# Patient Record
Sex: Female | Born: 1945 | Race: White | Hispanic: No | Marital: Married | Smoking: Never smoker
Health system: Southern US, Community
[De-identification: ages and names within clinical notes are randomized; demographics above are authoritative.]

## PROBLEM LIST (undated history)

## (undated) DIAGNOSIS — I447 Left bundle-branch block, unspecified: Secondary | ICD-10-CM

## (undated) DIAGNOSIS — F419 Anxiety disorder, unspecified: Secondary | ICD-10-CM

## (undated) DIAGNOSIS — I10 Essential (primary) hypertension: Secondary | ICD-10-CM

## (undated) DIAGNOSIS — R197 Diarrhea, unspecified: Secondary | ICD-10-CM

## (undated) DIAGNOSIS — I4891 Unspecified atrial fibrillation: Secondary | ICD-10-CM

## (undated) DIAGNOSIS — I2510 Atherosclerotic heart disease of native coronary artery without angina pectoris: Secondary | ICD-10-CM

## (undated) DIAGNOSIS — M1990 Unspecified osteoarthritis, unspecified site: Secondary | ICD-10-CM

## (undated) DIAGNOSIS — I219 Acute myocardial infarction, unspecified: Secondary | ICD-10-CM

## (undated) DIAGNOSIS — Z9289 Personal history of other medical treatment: Secondary | ICD-10-CM

## (undated) DIAGNOSIS — E785 Hyperlipidemia, unspecified: Secondary | ICD-10-CM

## (undated) DIAGNOSIS — E039 Hypothyroidism, unspecified: Secondary | ICD-10-CM

## (undated) HISTORY — DX: Unspecified osteoarthritis, unspecified site: M19.90

## (undated) HISTORY — DX: Hypothyroidism, unspecified: E03.9

## (undated) HISTORY — DX: Essential (primary) hypertension: I10

## (undated) HISTORY — DX: Hyperlipidemia, unspecified: E78.5

## (undated) HISTORY — PX: OTHER SURGICAL HISTORY: SHX169

## (undated) HISTORY — DX: Personal history of other medical treatment: Z92.89

## (undated) HISTORY — DX: Acute myocardial infarction, unspecified: I21.9

## (undated) HISTORY — DX: Left bundle-branch block, unspecified: I44.7

## (undated) HISTORY — PX: COLONOSCOPY: SHX174

## (undated) HISTORY — DX: Unspecified atrial fibrillation: I48.91

## (undated) HISTORY — DX: Anxiety disorder, unspecified: F41.9

## (undated) HISTORY — DX: Atherosclerotic heart disease of native coronary artery without angina pectoris: I25.10

---

## 2006-12-29 ENCOUNTER — Ambulatory Visit (HOSPITAL_COMMUNITY): Admission: RE | Admit: 2006-12-29 | Discharge: 2006-12-29 | Payer: Self-pay | Admitting: Family Medicine

## 2007-01-13 ENCOUNTER — Ambulatory Visit (HOSPITAL_COMMUNITY): Admission: RE | Admit: 2007-01-13 | Discharge: 2007-01-13 | Payer: Self-pay | Admitting: Family Medicine

## 2007-04-08 ENCOUNTER — Ambulatory Visit (HOSPITAL_COMMUNITY): Admission: RE | Admit: 2007-04-08 | Discharge: 2007-04-08 | Payer: Self-pay | Admitting: Family Medicine

## 2009-01-06 ENCOUNTER — Inpatient Hospital Stay (HOSPITAL_COMMUNITY): Admission: EM | Admit: 2009-01-06 | Discharge: 2009-01-15 | Payer: Self-pay | Admitting: Cardiology

## 2009-01-06 ENCOUNTER — Encounter: Payer: Self-pay | Admitting: Emergency Medicine

## 2009-01-06 ENCOUNTER — Ambulatory Visit: Payer: Self-pay | Admitting: Cardiology

## 2009-01-08 ENCOUNTER — Encounter: Payer: Self-pay | Admitting: Cardiovascular Disease

## 2009-01-08 HISTORY — PX: CARDIAC CATHETERIZATION: SHX172

## 2009-04-23 ENCOUNTER — Ambulatory Visit (HOSPITAL_COMMUNITY): Admission: RE | Admit: 2009-04-23 | Discharge: 2009-04-23 | Payer: Self-pay | Admitting: Family Medicine

## 2009-06-12 ENCOUNTER — Other Ambulatory Visit: Admission: RE | Admit: 2009-06-12 | Discharge: 2009-06-12 | Payer: Self-pay | Admitting: Obstetrics and Gynecology

## 2010-06-26 DIAGNOSIS — Z9289 Personal history of other medical treatment: Secondary | ICD-10-CM

## 2010-06-26 HISTORY — DX: Personal history of other medical treatment: Z92.89

## 2010-12-09 LAB — CBC
HCT: 33.8 % — ABNORMAL LOW (ref 36.0–46.0)
HCT: 34.7 % — ABNORMAL LOW (ref 36.0–46.0)
HCT: 33.5 % — ABNORMAL LOW (ref 36.0–46.0)
HCT: 35.7 % — ABNORMAL LOW (ref 36.0–46.0)
HCT: 37.7 % (ref 36.0–46.0)
Hemoglobin: 11.6 g/dL — ABNORMAL LOW (ref 12.0–15.0)
Hemoglobin: 12.6 g/dL (ref 12.0–15.0)
Hemoglobin: 12.1 g/dL (ref 12.0–15.0)
Hemoglobin: 12 g/dL (ref 12.0–15.0)
Hemoglobin: 13.1 g/dL (ref 12.0–15.0)
MCHC: 34.7 g/dL (ref 30.0–36.0)
MCHC: 34.6 g/dL (ref 30.0–36.0)
MCHC: 34.7 g/dL (ref 30.0–36.0)
MCHC: 34.8 g/dL (ref 30.0–36.0)
MCHC: 34.8 g/dL (ref 30.0–36.0)
MCV: 92.9 fL (ref 78.0–100.0)
MCV: 93.5 fL (ref 78.0–100.0)
MCV: 92.5 fL (ref 78.0–100.0)
MCV: 92.6 fL (ref 78.0–100.0)
MCV: 93.3 fL (ref 78.0–100.0)
MCV: 93.4 fL (ref 78.0–100.0)
MCV: 94.3 fL (ref 78.0–100.0)
Platelets: 183 10*3/uL (ref 150–400)
Platelets: 223 10*3/uL (ref 150–400)
Platelets: 189 10*3/uL (ref 150–400)
Platelets: 198 10*3/uL (ref 150–400)
Platelets: 199 10*3/uL (ref 150–400)
RBC: 3.59 MIL/uL — ABNORMAL LOW (ref 3.87–5.11)
RBC: 3.99 MIL/uL (ref 3.87–5.11)
RDW: 13.1 % (ref 11.5–15.5)
RDW: 13.4 % (ref 11.5–15.5)
RDW: 12.9 % (ref 11.5–15.5)
RDW: 13.1 % (ref 11.5–15.5)
RDW: 13.3 % (ref 11.5–15.5)
RDW: 13.4 % (ref 11.5–15.5)
WBC: 4.8 10*3/uL (ref 4.0–10.5)
WBC: 4.9 10*3/uL (ref 4.0–10.5)
WBC: 5.8 10*3/uL (ref 4.0–10.5)

## 2010-12-09 LAB — BASIC METABOLIC PANEL
BUN: 16 mg/dL (ref 6–23)
BUN: 10 mg/dL (ref 6–23)
BUN: 10 mg/dL (ref 6–23)
CO2: 29 mEq/L (ref 19–32)
CO2: 26 mEq/L (ref 19–32)
CO2: 27 mEq/L (ref 19–32)
CO2: 28 mEq/L (ref 19–32)
Calcium: 9.4 mg/dL (ref 8.4–10.5)
Calcium: 8.5 mg/dL (ref 8.4–10.5)
Calcium: 8.9 mg/dL (ref 8.4–10.5)
Chloride: 109 mEq/L (ref 96–112)
Chloride: 107 mEq/L (ref 96–112)
Chloride: 104 mEq/L (ref 96–112)
Creatinine, Ser: 0.58 mg/dL (ref 0.4–1.2)
Creatinine, Ser: 0.59 mg/dL (ref 0.4–1.2)
Creatinine, Ser: 0.56 mg/dL (ref 0.4–1.2)
Creatinine, Ser: 0.63 mg/dL (ref 0.4–1.2)
GFR calc non Af Amer: >60 mL/min (ref >60)
GFR calc non Af Amer: >60 mL/min (ref >60)
GFR calc non Af Amer: >60 mL/min (ref >60)
GFR calc non Af Amer: >60 mL/min (ref >60)
Glucose, Bld: 141 mg/dL — ABNORMAL HIGH (ref 70–99)
Glucose, Bld: 107 mg/dL — ABNORMAL HIGH (ref 70–99)
Glucose, Bld: 93 mg/dL (ref 70–99)
Glucose, Bld: 97 mg/dL (ref 70–99)
Potassium: 4.3 mEq/L (ref 3.5–5.1)
Potassium: 4.1 mEq/L (ref 3.5–5.1)
Potassium: 4 mEq/L (ref 3.5–5.1)
Sodium: 138 mEq/L (ref 135–145)
Sodium: 141 mEq/L (ref 135–145)
Sodium: 134 mEq/L — ABNORMAL LOW (ref 135–145)

## 2010-12-09 LAB — CARDIAC PANEL(CRET KIN+CKTOT+MB+TROPI)
CK, MB: 15.3 ng/mL — ABNORMAL HIGH (ref 0.3–4.0)
CK, MB: 6.6 ng/mL — ABNORMAL HIGH (ref 0.3–4.0)
CK, MB: 8.3 ng/mL — ABNORMAL HIGH (ref 0.3–4.0)
Relative Index: 9 — ABNORMAL HIGH (ref 0.0–2.5)
Total CK: 113 U/L (ref 7–177)
Total CK: 170 U/L (ref 7–177)
Total CK: 81 U/L (ref 7–177)
Troponin I: 0.44 ng/mL — ABNORMAL HIGH (ref 0.00–0.06)

## 2010-12-09 LAB — POCT CARDIAC MARKERS: Myoglobin, poc: 51.8 ng/mL (ref 12–200)

## 2010-12-09 LAB — DIFFERENTIAL
Basophils Absolute: 0 10*3/uL (ref 0.0–0.1)
Eosinophils Absolute: 0 10*3/uL (ref 0.0–0.7)
Eosinophils Relative: 0 % (ref 0–5)
Lymphocytes Relative: 13 % (ref 12–46)
Monocytes Absolute: 0.5 10*3/uL (ref 0.1–1.0)

## 2010-12-09 LAB — COMPREHENSIVE METABOLIC PANEL
ALT: 20 U/L (ref 0–35)
AST: 31 U/L (ref 0–37)
Albumin: 3 g/dL — ABNORMAL LOW (ref 3.5–5.2)
Calcium: 8.6 mg/dL (ref 8.4–10.5)
Chloride: 106 mEq/L (ref 96–112)
Creatinine, Ser: 0.57 mg/dL (ref 0.4–1.2)
Sodium: 140 mEq/L (ref 135–145)

## 2010-12-09 LAB — HEPARIN LEVEL (UNFRACTIONATED)
Heparin Unfractionated: 0.54 IU/mL (ref 0.30–0.70)
Heparin Unfractionated: 0.58 IU/mL (ref 0.30–0.70)

## 2010-12-09 LAB — PROTIME-INR
INR: 1.9 — ABNORMAL HIGH (ref 0.00–1.49)
INR: 2 — ABNORMAL HIGH (ref 0.00–1.49)
INR: 2.1 — ABNORMAL HIGH (ref 0.00–1.49)
INR: 1.1 (ref 0.00–1.49)
Prothrombin Time: 24.7 seconds — ABNORMAL HIGH (ref 11.6–15.2)
Prothrombin Time: 13.3 seconds (ref 11.6–15.2)
Prothrombin Time: 14.5 seconds (ref 11.6–15.2)
Prothrombin Time: 15.2 seconds (ref 11.6–15.2)
Prothrombin Time: 21.5 seconds — ABNORMAL HIGH (ref 11.6–15.2)

## 2010-12-17 ENCOUNTER — Other Ambulatory Visit: Payer: Self-pay | Admitting: Cardiovascular Disease

## 2010-12-22 ENCOUNTER — Other Ambulatory Visit: Payer: Self-pay

## 2010-12-22 NOTE — Telephone Encounter (Signed)
Refill for simvastatin denied.  Patient is not being followed by Dr. Mariah Milling.

## 2011-01-13 NOTE — Cardiovascular Report (Signed)
NAMEGALENA, LOGIE NO.:  [id number]   MEDICAL RECORD NO.:  [id number]          PATIENT TYPE:  INP   LOCATION:  2917                         FACILITY:  [facility]   PHYSICIAN:  Antonieta Iba, MD   DATE OF BIRTH:  01-27-1946   DATE OF PROCEDURE:  DATE OF DISCHARGE:                            CARDIAC CATHETERIZATION   PHYSICIAN PERFORMING THE PROCEDURE:  Antonieta Iba, MD   REASON FOR PROCEDURE:  Ms. Doyle is a very pleasant 65 year old woman,  previously seen by Dr. Domingo Sep in the clinic, who presented with a non-  ST-elevation MI.  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  The chest pain re-presented on Saturday, 2 days ago,  lasting for several hours.  She went to [facility] and her pain  resolved with nitroglycerin.  Her initial point-of-care and the cardiac  enzymes were elevated and she was transferred to [facility].  Since her arrival, she has been pain free.  A repeat cardiac enzymes  were 1.6 with a CK-MB of 15.  She presents to the cardiac  catheterization for further evaluation of her coronary anatomy.   PROCEDURE IN DETAIL:  The risks and benefits of the procedure were  discussed with the patient and consent was obtained.  She was brought to  the cardiac catheterization lab, and prepped and draped in the usual  sterile fashion.  The modified Seldinger technique was used to cannulate  the right femoral artery.  A 5-French sheath was inserted.  A Judkins  left #4 and a right #4 catheter were used to engage the left main and  the RCA ostium respectively.  Hand injection of contrast was used to  visualize the coronary anatomy with cinematography.  A 5-French pigtail  was used to cross the aortic valve and a LV gram was performed.  The  sheath was removed at the end of the case and manual pressure held and  hemostasis obtained.  No complications were reported at the time of this  dictation.   CORONARY  ANATOMY:  Left Main:  The left main is a moderate-to-large  sized vessel that bifurcates into LAD and left circumflex.  There is no  significant disease noted.   Left Anterior Descending:  The LAD is a moderate-to-large sized vessel  with several small diagonal branches.  There is mild disease in the  proximal LAD, estimated at 20%-30% in sequential lesions.  Otherwise, no  significant disease noted.   Left Circumflex:  Moderate-to-large sized vessel with a moderate-sized  OM taking off proximally.  There is no significant disease noted.   Right Coronary Artery:  The dominant vessel that extends distally with a  PD branch and PL branch.  There is 20% proximal to mid  RCA disease that  is diffuse and 30% ostial PDA disease.   LV Gram:  LV gram shows ejection fraction estimated at 30%-35% with some  apical, inferoapical, and anteroapical hypokinesis.  There is mild  aortic stenosis and pull back with a gradient of 20 mmHg.  FINAL IMPRESSION:  Mild coronary artery disease, notably in the left  anterior descending and the right coronary artery.  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  She also has a mild gradient across her aortic  valve.  She will be managed medically for coronary artery disease, kept  overnight given her elevated troponins of 1.6 with repeat troponins  pending.  She will follow up in clinic for outpatient echocardiogram to  evaluate her aortic valve and her ejection fraction.      Antonieta Iba, MD  Electronically Signed     TJG/MEDQ  D:  01/07/2009  T:  01/08/2009  Job:  [id number]

## 2011-01-13 NOTE — H&P (Signed)
NAMECHANYA, CHRISLEY NO.:  [id number]   MEDICAL RECORD NO.:  [id number]          PATIENT TYPE:  INP   LOCATION:  2917                         FACILITY:  [facility]   PHYSICIAN:  Marca Ancona, MD      DATE OF BIRTH:  07-Aug-1946   DATE OF ADMISSION:  01/06/2009  DATE OF DISCHARGE:                              HISTORY & PHYSICAL   PRIMARY CARDIOLOGIST:  Georga Hacking, MD   PRIMARY CARE PHYSICIAN:  [facility] in [place].   HISTORY OF PRESENT ILLNESS:  This is a 65 year old with minimal past  medical history who presents with a non-ST elevation MI.  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  One week ago, she developed substernal chest  pressure at work, which was mild and lasted for about 4 hours before  resolving.  It was mild, so she kept on working.  Today, the patient was  at work again and developed more severe substernal chest pain, this was  a pressure sensation in her chest associated with nausea and shortness  of breath.  The pain began at 12 p.m., she went home at 3:30.  She was  still having chest pain at that time.  She laid down on the bed and the  pain decreased, but whenever she would get up to walk around, the pain  would increase again.  As the pain continued, she went to [facility] at 8:30 tonight.  The pain did resolve at [facility] with  nitroglycerin.  Her initial set of cardiac markers was positive, so the  patient was transferred here to [facility].  She had been pain  free when she left [facility], and she was pain free when she arrived to  [facility].   PAST MEDICAL HISTORY:  1. 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  2. Heart murmur.  3. Stress test about 1-2 years ago that per the patient's report was      negative.   MEDICATIONS:  Naprosyn p.r.n.   ALLERGIES:  No known drug allergies.   SOCIAL HISTORY:  The patient lives in [place] with her husband.  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  She is married.  She has 3 children.  She has never smoked.  Does not drink alcohol.  Does not use any illicit drugs.   FAMILY HISTORY:  There is no premature coronary artery disease.  Her  mother had lymphoma.   REVIEW OF SYSTEMS:  All systems were reviewed and were negative except  as noted in the history of present illness.   PHYSICAL EXAMINATION:  VITAL SIGNS:  The patient is afebrile.  Pulse is  78, blood pressure 130/76, and oxygen saturation 98% on 2 L nasal  cannula.  GENERAL:  This is a well-developed female in no apparent distress.  HEENT:  Normal exam.  ABDOMEN:  Soft, nontender.  Normal bowel sounds.  No hepatosplenomegaly.  NECK:  Supple without lymphadenopathy.  No thyromegaly or thyroid  nodule.  There is somewhat elevated JVP at  about 9-10 cm of water.  CARDIOVASCULAR:  Heart regular.  S1 and S2.  No S3, no S4.  There is no  murmur.  There are 2+ posterior tibial pulses bilaterally.  There is no  peripheral edema.  EXTREMITIES:  No clubbing or cyanosis.  LUNGS:  Clear to auscultation bilaterally with normal respiratory  effort.  MUSCULOSKELETAL:  Normal exam.  SKIN:  Normal exam.  NEUROLOGIC:  Alert and oriented x3 with normal affect.   RADIOLOGY:  Chest x-ray shows cardiomegaly without acute disease.  EKG:  Normal sinus rhythm, left axis deviation, LVH with repolarization  abnormality, borderline left bundle-branch block.   LABORATORY DATA:  White count of 8.2, hematocrit 34.7, and platelets  223.  Potassium 4.1, creatinine 0.59.  BNP 249.  CK-MB 9.9, troponin was  0.54 initial set.   ASSESSMENT AND PLAN:  This is a 65 year old with minimal past medical  history who presents with a non-ST elevation myocardial infarction.  1. Non-ST elevation myocardial infarction.  The patient does have      elevated enzymes with suggestive chest pain.  She has no prior      history of coronary artery disease.  She does report a stress test      1-2 years ago that  was negative.  Interestingly, she really has      essentially no risk factors for coronary artery disease.  She is      now chest pain free.  We will continue her nitroglycerin paste and      her heparin drip.  She will be continued on aspirin.  We will start      Lopressor 12.5 mg q.6 h., high-dose statin, Plavix 600 mg load and      then 75 mg daily.  We will plan on heart catheterization in the      morning.  2. Volume status.  The patient does appear mildly volume overloaded      with increased jugular venous pressure and elevated BNP.  I will      hold any IV fluids for now.  When she has a heart catheterization      in the morning, we will check a ventriculogram.      Marca Ancona, MD  Electronically Signed     DM/MEDQ  D:  01/07/2009  T:  01/07/2009  Job:  [id number]

## 2011-01-13 NOTE — Discharge Summary (Signed)
Shelby Brewer, Shelby Brewer                  ACCOUNT NO.:  [id number]   MEDICAL RECORD NO.:  [id number]          PATIENT TYPE:  INP   LOCATION:  2023                         FACILITY:  [facility]   PHYSICIAN:  Antonieta Iba, MD   DATE OF BIRTH:  10/15/1945   DATE OF ADMISSION:  01/06/2009  DATE OF DISCHARGE:  01/15/2009                               DISCHARGE SUMMARY   DISCHARGE DIAGNOSES:  1. Non-ST elevation myocardial infarction this admission.  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  3. Paroxysmal atrial fibrillation, sinus rhythm on amiodarone and      Coumadin at discharge.  4. Hypotension, stable at discharge.   HOSPITAL COURSE:  The patient is a 65 year old female from [facility] who presented on Jan 07, 2009 with unstable angina  from [place].  She was transferred as a non-ST elevation MI.  She was  initially admitted by Dr. Tresa Endo, but after arrival was determined that  the patient has actually seen our group in the past.  The patient was  seen by Dr. Mariah Milling on Jan 07, 2009.  Her troponin was 1.59.  She was  taken to the cath lab by Dr. Mariah Milling on Jan 07, 2009.  This revealed a  20% RCA, normal left main, normal circumflex, and 30% LAD.  EF was 30-  35% with apical, inferoapical, and anteroapical hypokinesis consistent  with Tako-Tsubo type cardiomyopathy.  She also had mild AS with a 20-mm  gradient.  She was put on medical therapy.  ACE inhibitor, beta-blocker,  aspirin, Plavix, and statin were added.  She was transferred to  telemetry and ambulated.  Followup echocardiogram done on Jan 08, 2009  showed her EF had already improved to 50-55% with mild anterior  hypokinesis.  We were about to discharge her on Jan 09, 2009 when she  went in atrial fibrillation with increased ventricular response  associated with hypotension.  This resolved  spontaneously.  We held off  on her ACE inhibitor and changed her metoprolol 25 mg q.8.  She had  recurrent atrial fibrillation and we started her on heparin later on Jan 09, 2009.  She was in and out of atrial fibrillation with hypotension on  Jan 10, 2009.  We added Coumadin.  Lanoxin was added at one point for  rate control as her AF was fast.  She also has a left bundle-branch  block pattern.  We gently tried to adjust her metoprolol and added  diltiazem.  Hypotension continued to be an issue.  We started amiodarone  on Jan 13, 2009.  By Jan 14, 2009, her rhythm had been mainly sinus  although she still had some breakthrough atrial fibrillation.  We ended  up stopping her Lanoxin and diltiazem and cut back on her beta-blocker.  By Jan 15, 2009, she has been relatively stable.  She did have a  breakthrough run of atrial fibrillation during the night but it was  short-lived and less  than 100 and not symptomatic.  We feel she can be  discharged on Jan 15, 2009.  She will follow up with Dr. Mariah Milling in  [place].  We cut her amiodarone back to 200 mg b.i.d. at discharge.   DISCHARGE MEDICATIONS:  1. Metoprolol 50 mg b.i.d.  2. Aspirin 81 mg a day.  3. Pepcid 20 mg a day.  4. Crestor 10 mg a day.  5. Amiodarone 200 mg twice a day.  6. Coumadin 2.5 mg a day or as directed.  7. Nitroglycerin sublingual p.r.n.   DISCHARGE LABORATORY DATA:  INR is 2.0.  Sodium 38, potassium 4.2, BUN  14, and creatinine 0.69.  White count 5.8, hemoglobin 12.8, hematocrit  37.1, and platelets 206.  EKG shows sinus rhythm with left bundle-branch  block.  X-ray on Jan 06, 2009 shows cardiomegaly without acute disease.  Other labs during this admission, TSH 3.17.  Troponins peaked at 0.49  with a CK of 112 and an MB of 8.  Liver functions were normal.   DISPOSITION:  The patient is discharged in stable condition and will  followup with Dr. Mariah Milling in [place].  She will have a Protime this  Thursday.  Her  amiodarone can be cut back further as an outpatient.  Her  appointment is scheduled for January 31, 2009 at 9:30.      Abelino Derrick, P.A.      Antonieta Iba, MD  Electronically Signed    LKK/MEDQ  D:  01/15/2009  T:  01/15/2009  Job:  [id number]   cc:   [facility]

## 2011-01-15 ENCOUNTER — Other Ambulatory Visit (HOSPITAL_COMMUNITY): Payer: Self-pay | Admitting: Family Medicine

## 2011-01-15 DIAGNOSIS — Z139 Encounter for screening, unspecified: Secondary | ICD-10-CM

## 2011-01-16 NOTE — Procedures (Signed)
Shelby Brewer, Shelby Brewer                  ACCOUNT NO.:  [id number]   MEDICAL RECORD NO.:  [id number]          PATIENT TYPE:  OUT   LOCATION:  RESP                          FACILITY:  [facility]   PHYSICIAN:  Edward L. Juanetta Gosling, M.D.DATE OF BIRTH:  1946/02/28   DATE OF PROCEDURE:  DATE OF DISCHARGE:  04/08/2007                            PULMONARY FUNCTION TEST   1. Spirometry shows no evidence of airflow obstruction, normal      capacities.  2. Lung volumes are normal.  3. DLCO is normal.  4. Arterial blood gases are normal.      Edward L. Juanetta Gosling, M.D.  Electronically Signed     ELH/MEDQ  D:  04/14/2007  T:  04/15/2007  Job:  [id number]   cc:   Kirk Ruths, M.D.  Fax: [phone]

## 2011-01-22 ENCOUNTER — Ambulatory Visit (HOSPITAL_COMMUNITY)
Admission: RE | Admit: 2011-01-22 | Discharge: 2011-01-22 | Disposition: A | Discharge status: Home / Self Care | Payer: Medicare Other | Source: Ambulatory Visit | Attending: Family Medicine | Admitting: Family Medicine

## 2011-01-22 DIAGNOSIS — Z139 Encounter for screening, unspecified: Secondary | ICD-10-CM

## 2011-01-22 DIAGNOSIS — Z1231 Encounter for screening mammogram for malignant neoplasm of breast: Secondary | ICD-10-CM | POA: Insufficient documentation

## 2011-06-01 DIAGNOSIS — Z9289 Personal history of other medical treatment: Secondary | ICD-10-CM

## 2011-06-01 HISTORY — DX: Personal history of other medical treatment: Z92.89

## 2011-06-15 LAB — BLOOD GAS, ARTERIAL
Bicarbonate: 26.2 — ABNORMAL HIGH
TCO2: 23.2
pH, Arterial: 7.416 — ABNORMAL HIGH
pO2, Arterial: 93.5

## 2011-11-26 ENCOUNTER — Other Ambulatory Visit (HOSPITAL_COMMUNITY): Payer: Self-pay | Admitting: Physician Assistant

## 2011-11-26 ENCOUNTER — Ambulatory Visit (HOSPITAL_COMMUNITY)
Admission: RE | Admit: 2011-11-26 | Discharge: 2011-11-26 | Disposition: A | Discharge status: Home / Self Care | Payer: Medicare Other | Source: Ambulatory Visit | Attending: Physician Assistant | Admitting: Physician Assistant

## 2011-11-26 DIAGNOSIS — M79609 Pain in unspecified limb: Secondary | ICD-10-CM | POA: Insufficient documentation

## 2011-11-26 DIAGNOSIS — M25559 Pain in unspecified hip: Secondary | ICD-10-CM | POA: Insufficient documentation

## 2012-01-06 ENCOUNTER — Other Ambulatory Visit (HOSPITAL_COMMUNITY): Payer: Self-pay | Admitting: Family Medicine

## 2012-01-06 DIAGNOSIS — Z139 Encounter for screening, unspecified: Secondary | ICD-10-CM

## 2012-01-13 ENCOUNTER — Ambulatory Visit (HOSPITAL_COMMUNITY)
Admission: RE | Admit: 2012-01-13 | Discharge: 2012-01-13 | Disposition: A | Discharge status: Home / Self Care | Payer: Medicare Other | Source: Ambulatory Visit | Attending: Family Medicine | Admitting: Family Medicine

## 2012-01-13 DIAGNOSIS — Z139 Encounter for screening, unspecified: Secondary | ICD-10-CM

## 2012-01-13 DIAGNOSIS — Z780 Asymptomatic menopausal state: Secondary | ICD-10-CM | POA: Insufficient documentation

## 2012-01-13 DIAGNOSIS — Z13820 Encounter for screening for osteoporosis: Secondary | ICD-10-CM | POA: Insufficient documentation

## 2012-01-13 DIAGNOSIS — M899 Disorder of bone, unspecified: Secondary | ICD-10-CM | POA: Insufficient documentation

## 2012-01-22 ENCOUNTER — Other Ambulatory Visit (HOSPITAL_COMMUNITY): Payer: Self-pay | Admitting: Family Medicine

## 2012-01-22 DIAGNOSIS — Z139 Encounter for screening, unspecified: Secondary | ICD-10-CM

## 2012-01-26 ENCOUNTER — Ambulatory Visit (HOSPITAL_COMMUNITY)
Admission: RE | Admit: 2012-01-26 | Discharge: 2012-01-26 | Disposition: A | Discharge status: Home / Self Care | Payer: Medicare Other | Source: Ambulatory Visit | Attending: Family Medicine | Admitting: Family Medicine

## 2012-01-26 DIAGNOSIS — Z1231 Encounter for screening mammogram for malignant neoplasm of breast: Secondary | ICD-10-CM | POA: Insufficient documentation

## 2012-01-26 DIAGNOSIS — Z139 Encounter for screening, unspecified: Secondary | ICD-10-CM

## 2012-01-27 ENCOUNTER — Other Ambulatory Visit: Payer: Self-pay

## 2012-01-27 ENCOUNTER — Telehealth: Payer: Self-pay

## 2012-01-27 DIAGNOSIS — Z139 Encounter for screening, unspecified: Secondary | ICD-10-CM

## 2012-01-27 NOTE — Telephone Encounter (Signed)
MOVI PREP SPLIT DOSING, REGULAR BREAKFAST. CLEAR LIQUIDS AFTER 9 AM.  

## 2012-01-27 NOTE — Telephone Encounter (Signed)
REVIEWED.  

## 2012-01-27 NOTE — Telephone Encounter (Signed)
Gastroenterology Pre-Procedure Form  Pt said she has A-Fib and having problems with a rotator cuff only problems  Request Date: 01/27/2012    Requesting Physician: Dr. Regino Schultze     PATIENT INFORMATION:  Shelby Brewer is a 66 y.o., female (DOB=[birth year]).  PROCEDURE: Procedure(s) requested: colonoscopy Procedure Reason: screening for colon cancer  PATIENT REVIEW QUESTIONS: The patient reports the following:   1. Diabetes Melitis: no 2. Joint replacements in the past 12 months: no 3. Major health problems in the past 3 months: no 4. Has an artificial valve or MVP:no 5. Has been advised in past to take antibiotics in advance of a procedure like teeth cleaning: no}    MEDICATIONS & ALLERGIES:    Patient reports the following regarding taking any blood thinners:   Plavix? no Aspirin?yes  Coumadin?  no  Patient confirms/reports the following medications:  Current Outpatient Prescriptions  Medication Sig Dispense Refill  . aspirin 81 MG tablet Take 81 mg by mouth daily.      . clonazePAM (KLONOPIN) 1 MG tablet Take 1 mg by mouth 2 (two) times daily as needed.      Marland Kitchen levothyroxine (SYNTHROID, LEVOTHROID) 50 MCG tablet Take 50 mcg by mouth daily.      . meloxicam (MOBIC) 7.5 MG tablet Take 7.5 mg by mouth daily. Takes two tablets at once daily      . metoprolol tartrate (LOPRESSOR) 25 MG tablet Take 25 mg by mouth 2 (two) times daily. Pt takes two tablets in the Am and two in the PM      . nitroGLYCERIN (NITROSTAT) 0.4 MG SL tablet Place 0.4 mg under the tongue every 5 (five) minutes as needed.      . NON FORMULARY Calcium 600 mg Plus Vit D    One tablet daily      . simvastatin (ZOCOR) 20 MG tablet Take 20 mg by mouth every evening.        Patient confirms/reports the following allergies:  No Known Allergies  Patient is appropriate to schedule for requested procedure(s): yes  AUTHORIZATION INFORMATION Primary Insurance:   ID #:   Group #:  Pre-Cert / Auth required: Pre-Cert /  Auth #:   Secondary Insurance:   ID #:  Group #:  Pre-Cert / Auth required:  Pre-Cert / Auth #:   No orders of the defined types were placed in this encounter.    SCHEDULE INFORMATION: Procedure has been scheduled as follows:  Date: 02/12/2012    Time: 10:30 AM  Location: [facility] Short Stay  This Gastroenterology Pre-Precedure Form is being routed to the following provider(s) for review: Jonette Eva, MD

## 2012-01-28 NOTE — Telephone Encounter (Signed)
Rx and instructions mailed to pt.  

## 2012-02-08 ENCOUNTER — Telehealth: Payer: Self-pay | Admitting: Gastroenterology

## 2012-02-08 NOTE — Telephone Encounter (Signed)
Patient called and stated that her daughter has lymphoma and she is having to take care of her right now and she has to cancel her procedure but she will call back to R/S when things have settled with her daughter.

## 2012-02-12 ENCOUNTER — Ambulatory Visit (HOSPITAL_COMMUNITY): Admission: RE | Admit: 2012-02-12 | Payer: Medicare Other | Source: Ambulatory Visit | Admitting: Gastroenterology

## 2012-02-12 ENCOUNTER — Encounter (HOSPITAL_COMMUNITY): Admission: RE | Payer: Self-pay | Source: Ambulatory Visit

## 2012-02-12 SURGERY — COLONOSCOPY
Anesthesia: Moderate Sedation

## 2012-10-26 ENCOUNTER — Telehealth: Payer: Self-pay | Admitting: *Deleted

## 2012-10-26 NOTE — Telephone Encounter (Signed)
Shelby Brewer called today to be triaged for a colonoscopy. Please call her back. Thank you.

## 2012-10-26 NOTE — Telephone Encounter (Signed)
Pt is scheduled for an OV on 11/15/2012 at 1:30 PM with Lorenza Burton, NP, for diarrhea, abdominal cramps, and a lot of gas, prior to scheduling colonoscopy.

## 2012-11-15 ENCOUNTER — Encounter: Payer: Self-pay | Admitting: Urgent Care

## 2012-11-15 ENCOUNTER — Ambulatory Visit (INDEPENDENT_AMBULATORY_CARE_PROVIDER_SITE_OTHER): Payer: Medicare Other | Admitting: Urgent Care

## 2012-11-15 VITALS — BP 140/88 | HR 61 | Temp 97.4°F | Ht 64.0 in | Wt 125.0 lb

## 2012-11-15 DIAGNOSIS — R197 Diarrhea, unspecified: Secondary | ICD-10-CM

## 2012-11-15 DIAGNOSIS — K529 Noninfective gastroenteritis and colitis, unspecified: Secondary | ICD-10-CM

## 2012-11-15 MED ORDER — PEG 3350-KCL-NA BICARB-NACL 420 G PO SOLR: 4000.0000 mL | ORAL | Status: DC

## 2012-11-15 NOTE — Progress Notes (Signed)
Referring Provider: McGough, William, MD Primary Care Physician:  MCGOUGH,WILLIAM M, MD Primary Gastroenterologist:  Dr. Rourk  Chief Complaint  Patient presents with  . Colonoscopy    HPI:  Shelby Brewer is a 67 y.o. female here as a referral from Dr. McGough for diarrhea for 1 year.  She has been having abdominal bloating, flatus & cramps.   She has tried to watch what she eats.  She has had lots of chocolate recently.  Stools [place] #4, 6, or 7.  Denies rectal bleeding, melena, or mucus in stools.  +urgency.  BM 3-4 per day.  No recent foreign travel, antibiotics, or new meds.   Her mother & 1 child had non-Hodgkin's lymphoma.  Denies heartburn, indigestion, nausea, vomiting, dysphagia, odynophagia or anorexia.  She has recently gained a few pounds.  Thyroid has not been checked recently but she has hx of hypothyroidism.  Last colonoscopy over 10 yrs ago.  She takes mobic for arthritis for the past year.   Past Medical History  Diagnosis Date  . Atrial fibrillation   . MI (myocardial infarction)   . Hypothyroid   . Anxiety   . Arthritis     No past surgical history on file.  Current Outpatient Prescriptions  Medication Sig Dispense Refill  . aspirin 81 MG tablet Take 81 mg by mouth daily.      . clonazePAM (KLONOPIN) 1 MG tablet Take 1 mg by mouth 2 (two) times daily as needed.      . levothyroxine (SYNTHROID, LEVOTHROID) 50 MCG tablet Take 50 mcg by mouth daily.      . meloxicam (MOBIC) 7.5 MG tablet Take 7.5 mg by mouth daily. Takes two tablets at once daily      . metoprolol tartrate (LOPRESSOR) 25 MG tablet Take 25 mg by mouth 2 (two) times daily. Pt takes two tablets in the Am and two in the PM      . nitroGLYCERIN (NITROSTAT) 0.4 MG SL tablet Place 0.4 mg under the tongue every 5 (five) minutes as needed.      . NON FORMULARY Calcium 600 mg Plus Vit D    One tablet daily      . simvastatin (ZOCOR) 20 MG tablet Take 20 mg by mouth every evening.       No current  facility-administered medications for this visit.    Allergies as of 11/15/2012  . (No Known Allergies)    Family History:There is no known family history of colorectal carcinoma , liver disease, or inflammatory bowel disease.  Problem Relation Age of Onset  . Lymphoma Mother   . Lymphoma Daughter     History   Social History  . Marital Status: Married    Spouse Name: N/A    Number of Children: 3  . Years of Education: N/A   Occupational History  . RETIRED; Waitress    Social History Main Topics  . Smoking status: Never Smoker   . Smokeless tobacco: Not on file  . Alcohol Use: No  . Drug Use: No  . Sexually Active: Not on file   Other Topics Concern  . Not on file   Social History Narrative  . No narrative on file    Review of Systems: Gen: see HPI.  Denies fever or chills. CV: Denies chest pain, angina, palpitations, syncope, orthopnea, PND, peripheral edema, and claudication. Resp: Denies dyspnea at rest, dyspnea with exercise, cough, sputum, wheezing, coughing up blood, and pleurisy. GI: Denies vomiting blood, jaundice, and fecal   incontinence.   Denies dysphagia or odynophagia. GU : Denies urinary burning, blood in urine, urinary frequency, urinary hesitancy, nocturnal urination, and urinary incontinence. MS: + joint pain from arthritis.  Denies limitation of movement, and swelling, low back pain Denies muscle weakness, cramps, atrophy.  Derm: Denies rash, itching, dry skin, hives, moles, warts, or unhealing ulcers.  Psych: Denies depression, anxiety, memory loss, suicidal ideation, hallucinations, paranoia, and confusion. Heme: Denies bruising, bleeding, and enlarged lymph nodes. Neuro:  Denies any headaches, dizziness, paresthesias. Endo:  Denies any problems with DM, thyroid, adrenal function.  Physical Exam: BP 140/88  Pulse 61  Temp(Src) 97.4 F (36.3 C) (Oral)  Ht 5' 4" (1.626 m)  Wt 125 lb (56.7 kg)  BMI 21.45 kg/m2 No LMP recorded. Patient is  postmenopausal. General:   Alert,  Well-developed, well-nourished, pleasant and cooperative in NAD Head:  Normocephalic and atraumatic. Eyes:  Sclera clear, no icterus.   Conjunctiva pink. Ears:  Normal auditory acuity. Nose:  No deformity, discharge, or lesions. Mouth:  No deformity or lesions,oropharynx pink & moist. Neck:  Supple; no masses or thyromegaly. Lungs:  Clear throughout to auscultation.   No wheezes, crackles, or rhonchi. No acute distress. Heart:  Regular rate and rhythm; no murmurs, clicks, rubs,  or gallops. Abdomen:  Normal bowel sounds.  No bruits.  Soft, non-tender and non-distended without masses, hepatosplenomegaly or hernias noted.  No guarding or rebound tenderness.   Rectal:  Deferred. Msk:  Symmetrical without gross deformities. Normal posture. Pulses:  Normal pulses noted. Extremities:  No clubbing or edema. Neurologic:  Alert and oriented x4;  grossly normal neurologically. Skin:  Intact without significant lesions or rashes. Lymph Nodes:  No significant cervical adenopathy. Psych:  Alert and cooperative. Normal mood and affect.  

## 2012-11-15 NOTE — Patient Instructions (Addendum)
Begin ALIGN daily Hold Mobic for a week to see if it makes a difference Please get your labs as soon as possible.  Please return stool specimen to the lab as soon as possible.  We will call you with results. Colonoscopy with Dr Jena Gauss

## 2012-11-15 NOTE — Assessment & Plan Note (Addendum)
Shelby Brewer is a pleasant 67 y.o. female with 1 year history of diarrhea, bloating, & urgency.  Family history positive for lymphoma.  Diagnostic ileocolonoscopy with possible random biopsies with Dr Jena Gauss to further evaluate for source of diarrhea.  Differentials include celiac disease, med effect, microscopic colitis, IBS, thyroid disease, small bowel bacterial overgrowth or lymphoma.  I have discussed risks & benefits which include, but are not limited to, bleeding, infection, perforation & drug reaction.  The patient agrees with this plan & written consent will be obtained.      Stool c diff PCR, Cx, lactoferrin, giardia TTG IgA, IgA, CBC, CMP, TSH Begin ALIGN daily Hold Mobic for a week to see if it makes a difference

## 2012-11-16 NOTE — Progress Notes (Signed)
Faxed to PCP

## 2012-11-17 ENCOUNTER — Encounter (HOSPITAL_COMMUNITY): Payer: Self-pay | Admitting: Pharmacy Technician

## 2012-11-17 LAB — COMPREHENSIVE METABOLIC PANEL
AST: 25 U/L (ref 0–37)
Albumin: 4.8 g/dL (ref 3.5–5.2)
Alkaline Phosphatase: 66 U/L (ref 39–117)
BUN: 18 mg/dL (ref 6–23)
Calcium: 9.7 mg/dL (ref 8.4–10.5)
Chloride: 103 mEq/L (ref 96–112)
Creat: 0.71 mg/dL (ref 0.50–1.10)
Glucose, Bld: 88 mg/dL (ref 70–99)
Potassium: 4.8 mEq/L (ref 3.5–5.3)

## 2012-11-17 LAB — CBC WITH DIFFERENTIAL/PLATELET
Basophils Absolute: 0.1 10*3/uL (ref 0.0–0.1)
Basophils Relative: 1 % (ref 0–1)
Eosinophils Relative: 4 % (ref 0–5)
HCT: 39.4 % (ref 36.0–46.0)
Hemoglobin: 13.6 g/dL (ref 12.0–15.0)
MCH: 30.9 pg (ref 26.0–34.0)
MCHC: 34.5 g/dL (ref 30.0–36.0)
MCV: 89.5 fL (ref 78.0–100.0)
Monocytes Absolute: 0.6 10*3/uL (ref 0.1–1.0)
Monocytes Relative: 12 % (ref 3–12)
Neutro Abs: 2.9 10*3/uL (ref 1.7–7.7)
RDW: 13.7 % (ref 11.5–15.5)

## 2012-11-18 LAB — CLOSTRIDIUM DIFFICILE BY PCR: Toxigenic C. Difficile by PCR: NOT DETECTED

## 2012-11-18 LAB — GIARDIA/CRYPTOSPORIDIUM (EIA)
Cryptosporidium Screen (EIA): NEGATIVE
Giardia Screen (EIA): NEGATIVE

## 2012-11-18 LAB — FECAL LACTOFERRIN, QUANT: Lactoferrin: NEGATIVE

## 2012-11-18 NOTE — Progress Notes (Signed)
Quick Note:  Await all labs.  Will ask LSL to FU in my absence.  ______

## 2012-11-21 LAB — STOOL CULTURE

## 2012-11-22 NOTE — Progress Notes (Signed)
Quick Note:  Tried to call pt- LMOM ______ 

## 2012-11-22 NOTE — Progress Notes (Signed)
Quick Note:  Pt is aware. She is having a procedure on 11/24/12 ______

## 2012-11-23 NOTE — Progress Notes (Signed)
Pt aware of results 

## 2012-11-24 ENCOUNTER — Encounter (HOSPITAL_COMMUNITY)
Admission: RE | Disposition: A | Discharge status: Home / Self Care | Payer: Self-pay | Source: Ambulatory Visit | Attending: Internal Medicine

## 2012-11-24 ENCOUNTER — Encounter (HOSPITAL_COMMUNITY): Payer: Self-pay | Admitting: *Deleted

## 2012-11-24 ENCOUNTER — Ambulatory Visit (HOSPITAL_COMMUNITY)
Admission: RE | Admit: 2012-11-24 | Discharge: 2012-11-24 | Disposition: A | Discharge status: Home / Self Care | Payer: Medicare Other | Source: Ambulatory Visit | Attending: Internal Medicine | Admitting: Internal Medicine

## 2012-11-24 DIAGNOSIS — K5730 Diverticulosis of large intestine without perforation or abscess without bleeding: Secondary | ICD-10-CM

## 2012-11-24 DIAGNOSIS — R197 Diarrhea, unspecified: Secondary | ICD-10-CM | POA: Insufficient documentation

## 2012-11-24 DIAGNOSIS — E039 Hypothyroidism, unspecified: Secondary | ICD-10-CM | POA: Insufficient documentation

## 2012-11-24 DIAGNOSIS — M129 Arthropathy, unspecified: Secondary | ICD-10-CM | POA: Insufficient documentation

## 2012-11-24 DIAGNOSIS — Z791 Long term (current) use of non-steroidal anti-inflammatories (NSAID): Secondary | ICD-10-CM | POA: Insufficient documentation

## 2012-11-24 DIAGNOSIS — Z79899 Other long term (current) drug therapy: Secondary | ICD-10-CM | POA: Insufficient documentation

## 2012-11-24 DIAGNOSIS — I252 Old myocardial infarction: Secondary | ICD-10-CM | POA: Insufficient documentation

## 2012-11-24 DIAGNOSIS — F411 Generalized anxiety disorder: Secondary | ICD-10-CM | POA: Insufficient documentation

## 2012-11-24 DIAGNOSIS — I4891 Unspecified atrial fibrillation: Secondary | ICD-10-CM | POA: Insufficient documentation

## 2012-11-24 DIAGNOSIS — K529 Noninfective gastroenteritis and colitis, unspecified: Secondary | ICD-10-CM

## 2012-11-24 DIAGNOSIS — Z807 Family history of other malignant neoplasms of lymphoid, hematopoietic and related tissues: Secondary | ICD-10-CM | POA: Insufficient documentation

## 2012-11-24 DIAGNOSIS — Z7982 Long term (current) use of aspirin: Secondary | ICD-10-CM | POA: Insufficient documentation

## 2012-11-24 HISTORY — DX: Diarrhea, unspecified: R19.7

## 2012-11-24 HISTORY — PX: COLONOSCOPY: SHX5424

## 2012-11-24 SURGERY — COLONOSCOPY
Anesthesia: Moderate Sedation

## 2012-11-24 MED ORDER — ONDANSETRON HCL 4 MG/2ML IJ SOLN
INTRAMUSCULAR | Status: DC | PRN
  Administered 2012-11-24: 4 mg via INTRAVENOUS

## 2012-11-24 MED ORDER — SODIUM CHLORIDE 0.9 % IV SOLN
INTRAVENOUS | Status: DC
  Administered 2012-11-24: 11:00:00 via INTRAVENOUS

## 2012-11-24 MED ORDER — MEPERIDINE HCL 100 MG/ML IJ SOLN
INTRAMUSCULAR | Status: AC
  Filled 2012-11-24: qty 2

## 2012-11-24 MED ORDER — MIDAZOLAM HCL 5 MG/5ML IJ SOLN
INTRAMUSCULAR | Status: AC
  Filled 2012-11-24: qty 10

## 2012-11-24 MED ORDER — MIDAZOLAM HCL 5 MG/5ML IJ SOLN
INTRAMUSCULAR | Status: DC | PRN
  Administered 2012-11-24 (×2): 1 mg via INTRAVENOUS
  Administered 2012-11-24: 2 mg via INTRAVENOUS

## 2012-11-24 MED ORDER — ONDANSETRON HCL 4 MG/2ML IJ SOLN
INTRAMUSCULAR | Status: AC
  Filled 2012-11-24: qty 2

## 2012-11-24 MED ORDER — MEPERIDINE HCL 100 MG/ML IJ SOLN
INTRAMUSCULAR | Status: DC | PRN
  Administered 2012-11-24: 50 mg
  Administered 2012-11-24: 25 mg via INTRAVENOUS

## 2012-11-24 MED ORDER — STERILE WATER FOR IRRIGATION IR SOLN
Status: DC | PRN
  Administered 2012-11-24: 13:00:00

## 2012-11-24 NOTE — Op Note (Signed)
[facility] [address] [place], [address]   COLONOSCOPY PROCEDURE REPORT  PATIENT: Shelby Brewer, Shelby Brewer  MR#:         [id number] BIRTHDATE: March 11, 1946 , 66  yrs. old GENDER: Female ENDOSCOPIST: R.  Roetta Sessions, MD FACP FACG REFERRED BY:  Karleen Hampshire, M.D. PROCEDURE DATE:  11/24/2012 PROCEDURE:     Ileocolonoscopy with segmental biopsy  INDICATIONS: Chronic diarrhea; negative stool studies and celiac screen.  INFORMED CONSENT:  The risks, benefits, alternatives and imponderables including but not limited to bleeding, perforation as well as the possibility of a missed lesion have been reviewed.  The potential for biopsy, lesion removal, etc. have also been discussed.  Questions have been answered.  All parties agreeable. Please see the history and physical in the medical record for more information.  MEDICATIONS: Versed 4 mg IV and Demerol 75 mg IV in divided doses. Zofran 4 mg IV.  DESCRIPTION OF PROCEDURE:  After a digital rectal exam was performed, the EC-[id number] ([id number])  colonoscope was advanced from the anus through the rectum and colon to the area of the cecum, ileocecal valve and appendiceal orifice.  The cecum was deeply intubated.  These structures were well-seen and photographed for the record.  From the level of the cecum and ileocecal valve, the scope was slowly and cautiously withdrawn.  The mucosal surfaces were carefully surveyed utilizing scope tip deflection to facilitate fold flattening as needed.  The scope was pulled down into the rectum where a thorough examination including retroflexion was performed.    FINDINGS:  Adequate preparation. Normal rectum. Scattered left-sided diverticula; the remainder of the colonic mucosa appeared normal. Normal distal 10 cm of terminal ileal mucosa  THERAPEUTIC / DIAGNOSTIC MANEUVERS PERFORMED:  Segment biopsies of the sigmoid and ascending segments taken for histologic  study  COMPLICATIONS: None  CECAL WITHDRAWAL TIME:  8 minutes   IMPRESSION:  Colonic diverticulosis-status post segmental biopsy  RECOMMENDATIONS: Followup on pathology.   _______________________________ eSigned:  R. Roetta Sessions, MD FACP [facility] 11/24/2012 1:55 PM   CC:

## 2012-11-24 NOTE — Interval H&P Note (Signed)
History and Physical Interval Note:  11/24/2012 1:16 PM  Shelby Brewer  has presented today for surgery, with the diagnosis of CHRONIC DIARRHEA  The various methods of treatment have been discussed with the patient and family. After consideration of risks, benefits and other options for treatment, the patient has consented to  Procedure(s) with comments: COLONOSCOPY (N/A) - 11:30 as a surgical intervention .  The patient's history has been reviewed, patient examined, no change in status, stable for surgery.  I have reviewed the patient's chart and labs.  Questions were answered to the patient's satisfaction.      No improvement with cessation of Mobic therapy. Continues to have diarrhea ;  Celiac screen and stool studies negative. Colonoscopy today per plan.  The risks, benefits, limitations, alternatives and imponderables have been reviewed with the patient. Questions have been answered. All parties are agreeable.    Eula Listen

## 2012-11-24 NOTE — H&P (View-Only) (Signed)
Referring Provider: Karleen Hampshire, MD Primary Care Physician:  Kirk Ruths, MD Primary Gastroenterologist:  Dr. Jena Gauss  Chief Complaint  Patient presents with  . Colonoscopy    HPI:  Shelby Brewer is a 67 y.o. female here as a referral from Dr. Regino Schultze for diarrhea for 1 year.  She has been having abdominal bloating, flatus & cramps.   She has tried to watch what she eats.  She has had lots of chocolate recently.  Stools [place] #4, 6, or 7.  Denies rectal bleeding, melena, or mucus in stools.  +urgency.  BM 3-4 per day.  No recent foreign travel, antibiotics, or new meds.   Her mother & 1 child had non-Hodgkin's lymphoma.  Denies heartburn, indigestion, nausea, vomiting, dysphagia, odynophagia or anorexia.  She has recently gained a few pounds.  Thyroid has not been checked recently but she has hx of hypothyroidism.  Last colonoscopy over 10 yrs ago.  She takes mobic for arthritis for the past year.   Past Medical History  Diagnosis Date  . Atrial fibrillation   . MI (myocardial infarction)   . Hypothyroid   . Anxiety   . Arthritis     No past surgical history on file.  Current Outpatient Prescriptions  Medication Sig Dispense Refill  . aspirin 81 MG tablet Take 81 mg by mouth daily.      . clonazePAM (KLONOPIN) 1 MG tablet Take 1 mg by mouth 2 (two) times daily as needed.      Marland Kitchen levothyroxine (SYNTHROID, LEVOTHROID) 50 MCG tablet Take 50 mcg by mouth daily.      . meloxicam (MOBIC) 7.5 MG tablet Take 7.5 mg by mouth daily. Takes two tablets at once daily      . metoprolol tartrate (LOPRESSOR) 25 MG tablet Take 25 mg by mouth 2 (two) times daily. Pt takes two tablets in the Am and two in the PM      . nitroGLYCERIN (NITROSTAT) 0.4 MG SL tablet Place 0.4 mg under the tongue every 5 (five) minutes as needed.      . NON FORMULARY Calcium 600 mg Plus Vit D    One tablet daily      . simvastatin (ZOCOR) 20 MG tablet Take 20 mg by mouth every evening.       No current  facility-administered medications for this visit.    Allergies as of 11/15/2012  . (No Known Allergies)    Family History:There is no known family history of colorectal carcinoma , liver disease, or inflammatory bowel disease.  Problem Relation Age of Onset  . Lymphoma Mother   . Lymphoma Daughter     History   Social History  . Marital Status: Married    Spouse Name: N/A    Number of Children: 3  . Years of Education: N/A   Occupational History  . RETIRED; Waitress    Social History Main Topics  . Smoking status: Never Smoker   . Smokeless tobacco: Not on file  . Alcohol Use: No  . Drug Use: No  . Sexually Active: Not on file   Other Topics Concern  . Not on file   Social History Narrative  . No narrative on file    Review of Systems: Gen: see HPI.  Denies fever or chills. CV: Denies chest pain, angina, palpitations, syncope, orthopnea, PND, peripheral edema, and claudication. Resp: Denies dyspnea at rest, dyspnea with exercise, cough, sputum, wheezing, coughing up blood, and pleurisy. GI: Denies vomiting blood, jaundice, and fecal  incontinence.   Denies dysphagia or odynophagia. GU : Denies urinary burning, blood in urine, urinary frequency, urinary hesitancy, nocturnal urination, and urinary incontinence. MS: + joint pain from arthritis.  Denies limitation of movement, and swelling, low back pain Denies muscle weakness, cramps, atrophy.  Derm: Denies rash, itching, dry skin, hives, moles, warts, or unhealing ulcers.  Psych: Denies depression, anxiety, memory loss, suicidal ideation, hallucinations, paranoia, and confusion. Heme: Denies bruising, bleeding, and enlarged lymph nodes. Neuro:  Denies any headaches, dizziness, paresthesias. Endo:  Denies any problems with DM, thyroid, adrenal function.  Physical Exam: BP 140/88  Pulse 61  Temp(Src) 97.4 F (36.3 C) (Oral)  Ht 5\' 4"  (1.626 m)  Wt 125 lb (56.7 kg)  BMI 21.45 kg/m2 No LMP recorded. Patient is  postmenopausal. General:   Alert,  Well-developed, well-nourished, pleasant and cooperative in NAD Head:  Normocephalic and atraumatic. Eyes:  Sclera clear, no icterus.   Conjunctiva pink. Ears:  Normal auditory acuity. Nose:  No deformity, discharge, or lesions. Mouth:  No deformity or lesions,oropharynx pink & moist. Neck:  Supple; no masses or thyromegaly. Lungs:  Clear throughout to auscultation.   No wheezes, crackles, or rhonchi. No acute distress. Heart:  Regular rate and rhythm; no murmurs, clicks, rubs,  or gallops. Abdomen:  Normal bowel sounds.  No bruits.  Soft, non-tender and non-distended without masses, hepatosplenomegaly or hernias noted.  No guarding or rebound tenderness.   Rectal:  Deferred. Msk:  Symmetrical without gross deformities. Normal posture. Pulses:  Normal pulses noted. Extremities:  No clubbing or edema. Neurologic:  Alert and oriented x4;  grossly normal neurologically. Skin:  Intact without significant lesions or rashes. Lymph Nodes:  No significant cervical adenopathy. Psych:  Alert and cooperative. Normal mood and affect.

## 2012-11-28 ENCOUNTER — Encounter (HOSPITAL_COMMUNITY): Payer: Self-pay | Admitting: Internal Medicine

## 2012-12-04 ENCOUNTER — Encounter: Payer: Self-pay | Admitting: Internal Medicine

## 2012-12-05 ENCOUNTER — Telehealth: Payer: Self-pay

## 2012-12-05 NOTE — Telephone Encounter (Signed)
Letter from: Corbin Ade   Reason for Letter: Results Review   Send letter to patient.  Send copy of letter with path to referring provider and PCP.  Pt needs a follow-up appt w extender 4- 6 weeks Re: diarrhea

## 2012-12-06 ENCOUNTER — Encounter: Payer: Self-pay | Admitting: General Practice

## 2012-12-06 NOTE — Telephone Encounter (Signed)
LETTER MAILED

## 2012-12-06 NOTE — Telephone Encounter (Signed)
PT IS SCHEDULED TO SEE Shelby Brewer 12/27/12@10 :00AM

## 2012-12-22 ENCOUNTER — Encounter: Payer: Self-pay | Admitting: Internal Medicine

## 2012-12-27 ENCOUNTER — Ambulatory Visit (INDEPENDENT_AMBULATORY_CARE_PROVIDER_SITE_OTHER): Payer: Medicare Other | Admitting: Gastroenterology

## 2012-12-27 ENCOUNTER — Encounter: Payer: Self-pay | Admitting: Gastroenterology

## 2012-12-27 VITALS — BP 118/70 | HR 60 | Temp 97.6°F | Ht 64.0 in | Wt 128.2 lb

## 2012-12-27 DIAGNOSIS — R197 Diarrhea, unspecified: Secondary | ICD-10-CM

## 2012-12-27 DIAGNOSIS — K529 Noninfective gastroenteritis and colitis, unspecified: Secondary | ICD-10-CM

## 2012-12-27 MED ORDER — DICYCLOMINE HCL 10 MG PO CAPS: 10.0000 mg | ORAL_CAPSULE | Freq: Three times a day (TID) | ORAL | Status: DC

## 2012-12-27 NOTE — Assessment & Plan Note (Signed)
67 year old female with chronic diarrhea but negative colonoscopy and segmental biopsies recently. Stool studies and celiac serologies negative. She really only has 2-3 loose stools per day, and her main concern is flatus. No concerning signs such as abdominal pain, weight loss. I have discussed a strict lactose-free diet, bloat diet. Add Bentyl BID, low-dose with room for adjusting, for loose stool. Consider hydrogen breath test if no improvement. No abdominal imaging on file, as of note. Return in Sept 2014 per pt request.

## 2012-12-27 NOTE — Progress Notes (Signed)
Referring Provider: Karleen Hampshire, MD Primary Care Physician:  Kirk Ruths, MD Primary GI: Dr. Jena Gauss   Chief Complaint  Patient presents with  . Follow-up    HPI:   Shelby Brewer is a pleasant 67 year old female with a history of chronic diarrhea, presenting today in follow-up after colonoscopy March 2014. This showed colonic diverticulosis, with negative segmental biopsies. Prior to colonoscopy, stool studies and celiac serologies were all negative.  Drinks coffee each morning. Usually about 2-3 loose stools per day, mainly in the morning. Used to have abdominal cramping, but this is significantly improved. She feels this is related to gas. Gas is biggest concern. No belching, +gas.  Was prescribed Align but quit taking this. She feels it worsened urgency. No weight loss, lack of appetite. Gained about 7 lbs in the last year. States gas sometimes embarrassing. Feels like she is out of control sometimes. Tries to eat healthy. No upper GI symptoms. Not taking Mobic as often; only takes this if severe arthritis. Uses a little bit of milk in coffee, will eat a piece of cheese at night occasionally.   Past Medical History  Diagnosis Date  . Atrial fibrillation   . MI (myocardial infarction)   . Hypothyroid   . Anxiety   . Arthritis   . Diarrhea     Past Surgical History  Procedure Laterality Date  . Colonoscopy    . Left breast cystectomy    . Colonoscopy N/A 11/24/2012    [id number] diverticulosis-status post segmental biopsies, NEGATIVE PATH.     Current Outpatient Prescriptions  Medication Sig Dispense Refill  . aspirin 81 MG tablet Take 81 mg by mouth daily as needed for pain.       . calcium-vitamin D (OSCAL WITH D) 500-200 MG-UNIT per tablet Take 1 tablet by mouth daily.      . clonazePAM (KLONOPIN) 1 MG tablet Take 1 mg by mouth daily.       Marland Kitchen levothyroxine (SYNTHROID, LEVOTHROID) 50 MCG tablet Take 50 mcg by mouth daily.      . meloxicam (MOBIC) 7.5 MG tablet Take 7.5 mg  by mouth daily as needed.       . metoprolol tartrate (LOPRESSOR) 25 MG tablet Take 25 mg by mouth 3 (three) times daily.       . Multiple Vitamin (MULTIVITAMIN WITH MINERALS) TABS Take 1 tablet by mouth daily.      . nitroGLYCERIN (NITROSTAT) 0.4 MG SL tablet Place 0.4 mg under the tongue every 5 (five) minutes as needed.      . simvastatin (ZOCOR) 20 MG tablet Take 20 mg by mouth every evening.       No current facility-administered medications for this visit.    Allergies as of 12/27/2012  . (No Known Allergies)    Family History  Problem Relation Age of Onset  . Lymphoma Mother   . Lymphoma Daughter   . Colon cancer Neg Hx     History   Social History  . Marital Status: Married    Spouse Name: N/A    Number of Children: 3  . Years of Education: N/A   Occupational History  . RETIRED; Waitress    Social History Main Topics  . Smoking status: Never Smoker   . Smokeless tobacco: None  . Alcohol Use: No  . Drug Use: No  . Sexually Active: None   Other Topics Concern  . None   Social History Narrative  . None    Review of Systems: Negative unless mentioned  above  Physical Exam: BP 118/70  Pulse 60  Temp(Src) 97.6 F (36.4 C) (Oral)  Ht 5\' 4"  (1.626 m)  Wt 128 lb 3.2 oz (58.151 kg)  BMI 21.99 kg/m2 General:   Alert and oriented. No distress noted. Pleasant and cooperative.  Head:  Normocephalic and atraumatic. Eyes:  Conjuctiva clear without scleral icterus. Mouth:  Oral mucosa pink and moist. Good dentition. No lesions. Heart:  S1, S2 present without murmurs, rubs, or gallops. Regular rate and rhythm. Abdomen:  +BS, soft, non-tender and non-distended. No rebound or guarding. No HSM or masses noted. Msk:  Symmetrical without gross deformities. Normal posture. Extremities:  Without edema. Neurologic:  Alert and  oriented x4;  grossly normal neurologically. Skin:  Intact without significant lesions or rashes. Psych:  Alert and cooperative. Normal mood and  affect.

## 2012-12-27 NOTE — Patient Instructions (Addendum)
Please review the lactose free diet and the gas diet. Try to follow these to see if there is any improvement. I have sent a prescription called Bentyl to your pharmacy. Take this twice a day with the first meal of day and dinner. This is for loose stool and cramping. Monitor for any dry mouth, constipation.  We will see you back in September! Please call with any concerns or if you don't have improvement.   Lactose-Free Diet Lactose is a carbohydrate that is found mainly in milk and milk products, as well as in foods with added milk or whey. Lactose must be digested by the enzyme lactase in order to be used by the body. Lactose intolerance occurs when there is a shortage of lactase. When your body is not able to digest lactose, you may feel sick to your stomach (nausea), bloated, and have cramps, gas, and diarrhea. TYPES OF LACTASE DEFICIENCY  Primary lactase deficiency. This is the most common type. It is characterized by a slow decrease in lactase activity.  Secondary lactase deficiency. This occurs following injury to the small intestinal mucosa as a result of a disease or condition. It can also occur as a result of surgery or after treatment with antibiotic medicines or cancer drugs. Tolerance to lactose varies widely. Each person must determine how much milk can be consumed without developing symptoms. Drinking smaller portions of milk throughout the day may be helpful. Some studies suggest that slowing gastric emptying may help increase tolerance of milk products. This may be done by:  Consuming milk or milk products with a meal rather than alone.  Consuming milk with a higher fat content. There are many dairy products that may be tolerated better than milk by some people, including:  Cheese (especially aged cheese). The lactose content is much lower than in milk.  Cultured dairy products, such as yogurt, buttermilk, cottage cheese, and sweet acidophilus milk (kefir). These products are  usually well tolerated by lactase-deficient people. This is because the healthy bacteria help digest lactose.  Lactose-hydrolyzed milk. This product contains 40% to 90% less lactose than milk and may also be well tolerated. ADEQUACY These diets may be deficient in calcium, riboflavin, and vitamin D, according to the Recommended Dietary Allowances of the [employer]. Depending on individual tolerances and the use of milk substitutes, milk, or other dairy products, you may be able to meet these recommendations. SPECIAL NOTES  Lactose is a carbohydrate. The main food source for lactose is dairy products. Reading food labels is important. Many products contain lactose even when they are not made from milk. Look for the following words: whey, milk solids, dry milk solids, nonfat dry milk powder. Typical sources of lactose other than dairy products include breads, candies, cold cuts, prepared and processed foods, and commercial sauces and gravies.  All foods must be prepared without milk, cream, or other dairy foods.  A vitamin or mineral supplement may be necessary. Consult your caregiver or Registered Dietitian.  Lactose is also found in many prescription and over-the-counter medicines.  Soy milk and lactose-free supplements may be used as an alternative to milk. CHOOSING FOODS Breads and Starches  Allowed: Breads and rolls made without milk. [place], [place], or [place] bread. Soda crackers, graham crackers. Any crackers prepared without lactose. Cooked or dry cereals prepared without lactose (read labels). Any potatoes, pasta, or rice prepared without milk or lactose. Popcorn.  Avoid: Breads and rolls that contain milk. Prepared mixes such as muffins, biscuits, waffles, pancakes. Sweet  rolls, donuts, [place] toast (if made with milk or lactose). Zwieback crackers, corn curls, or any crackers that contain lactose. Cooked or dry cereals prepared with lactose (read labels). Instant  potatoes, frozen [place] fries, scalloped or au gratin potatoes. Vegetables  Allowed: Fresh, frozen, and canned vegetables.  Avoid: Creamed or breaded vegetables. Vegetables in a cheese sauce or with lactose-containing margarines. Fruit  Allowed: All fresh, canned, or frozen fruits that are not processed with lactose.  Avoid: Any canned or frozen fruits processed with lactose. Meat and Meat Substitutes  Allowed: Plain beef, chicken, fish, [place], lamb, veal, pork, or ham. Kosher prepared meat products. Strained or junior meats that do not contain milk. Eggs, soy meat substitutes, nuts.  Avoid: Scrambled eggs, omelets, and souffles that contain milk. Creamed or breaded meat, fish, or fowl. Sausage products such as wieners, liver sausage, or cold cuts that contain milk solids. Cheese, cottage cheese, or cheese spreads. Milk  Allowed: None.  Avoid: Milk (whole, 2%, skim, or chocolate). Evaporated, powdered, or condensed milk. Malted milk. Soups and Combination Foods  Allowed: Bouillon, broth, vegetable soups, clear soups, consomms. Homemade soups made with allowed ingredients. Combination or prepared foods that do not contain milk or milk products (read labels).  Avoid: Cream soups, chowders, commercially prepared soups containing lactose. Macaroni and cheese, pizza. Combination or prepared foods that contain milk or milk products. Desserts and Sweets  Allowed: Water and fruit ices, gelatin, angel food cake. Homemade cookies, pies, or cakes made from allowed ingredients. Pudding (if made with water or a milk substitute). Lactose-free tofu desserts. Sugar, honey, corn syrup, jam, jelly, marmalade, molasses (beet sugar). Pure sugar candy, marshmallows.  Avoid: Ice cream, ice milk, sherbet, custard, pudding, frozen yogurt. Commercial cake and cookie mixes. Desserts that contain chocolate. Pie crust made with milk-containing margarine. Reduced calorie desserts made with a sugar substitute that  contains lactose. Toffee, peppermint, butterscotch, chocolate, caramels. Fats and Oils  Allowed: Butter (as tolerated, contains very small amounts of lactose). Margarines and dressings that do not contain milk. Vegetable oils, shortening, mayonnaise, nondairy cream and whipped toppings without lactose or milk solids added. Tomasa Blase.  Avoid: Margarines and salad dressings containing milk. Cream, cream cheese, peanut butter with added milk solids, sour cream, chip dips made with sour cream. Beverages  Allowed: Carbonated drinks, tea, coffee and freeze-dried coffee, some instant coffees (check labels). Fruit drinks, fruit and vegetable juice, rice or soy milk.  Avoid: Hot chocolate. Some cocoas, some instant coffees, instant iced teas, powdered fruit drinks (read labels). Condiments  Allowed: Soy sauce, carob powder, olives, gravy made with water, baker's cocoa, pickles, pure seasonings and spices, wine, pure monosodium glutamate, catsup, mustard.  Avoid: Some chewing gums, chocolate, some cocoas. Certain antibiotics and vitamin or mineral preparations. Spice blends if they contain milk products. MSG extender. Artificial sweeteners that contain lactose. Some nondairy creamers (read labels). SAMPLE MENU Breakfast  Orange juice.  Banana.  Bran cereal.  Nondairy creamer.  Vienna bread, toasted.  Butter or milk-free margarine.  Coffee or tea. Lunch  Chicken breast.  Rice.  Green beans.  Butter or milk-free margarine.  Fresh melon.  Coffee or tea. Dinner  [place].  Baked potato.  Butter or milk-free margarine.  Broccoli.  Lettuce salad with vinegar and oil dressing.  [employer].  Coffee or tea. Document Released: 02/06/2002 Document Revised: 11/09/2011 Document Reviewed: 11/14/2010 [facility] Patient Information 2013 [place], [place].

## 2012-12-28 NOTE — Progress Notes (Signed)
Cc PCP 

## 2013-01-04 ENCOUNTER — Telehealth: Payer: Self-pay | Admitting: Gastroenterology

## 2013-01-04 ENCOUNTER — Telehealth: Payer: Self-pay | Admitting: Internal Medicine

## 2013-01-04 MED ORDER — DICYCLOMINE HCL 10 MG PO CAPS: 10.0000 mg | ORAL_CAPSULE | Freq: Three times a day (TID) | ORAL | Status: DC

## 2013-01-04 NOTE — Telephone Encounter (Signed)
Patient called to let us know that any prescriptions she is to have need to go to [facility]. She does not want anything going to [facility] pharmacy.

## 2013-01-04 NOTE — Telephone Encounter (Signed)
I have sent to [facility], 90 day supply.

## 2013-01-04 NOTE — Telephone Encounter (Signed)
Routing to refill box  

## 2013-01-04 NOTE — Telephone Encounter (Signed)
Rx was sent to [facility], but patient had requested it to be sent to [facility]

## 2013-01-04 NOTE — Addendum Note (Signed)
Addended by: Nira Retort on: 01/04/2013 02:12 PM   Modules accepted: Orders

## 2013-01-04 NOTE — Telephone Encounter (Signed)
Patient called back and asked for [place] to make any medications a 90 day supply. She requesting Bentyl

## 2013-02-06 ENCOUNTER — Ambulatory Visit: Payer: Medicare Other | Admitting: Cardiovascular Disease

## 2013-02-20 ENCOUNTER — Other Ambulatory Visit: Payer: Self-pay

## 2013-02-20 MED ORDER — METOPROLOL TARTRATE 25 MG PO TABS: ORAL_TABLET | ORAL | Status: DC

## 2013-02-20 NOTE — Telephone Encounter (Signed)
Rx was sent to pharmacy electronically. 

## 2013-03-26 ENCOUNTER — Encounter: Payer: Self-pay | Admitting: *Deleted

## 2013-03-28 ENCOUNTER — Ambulatory Visit (INDEPENDENT_AMBULATORY_CARE_PROVIDER_SITE_OTHER): Payer: Medicare Other | Admitting: Cardiovascular Disease

## 2013-03-28 VITALS — BP 140/88 | HR 57 | Ht 64.0 in | Wt 126.8 lb

## 2013-03-28 DIAGNOSIS — E039 Hypothyroidism, unspecified: Secondary | ICD-10-CM

## 2013-03-28 DIAGNOSIS — I447 Left bundle-branch block, unspecified: Secondary | ICD-10-CM

## 2013-03-28 DIAGNOSIS — E785 Hyperlipidemia, unspecified: Secondary | ICD-10-CM

## 2013-03-28 DIAGNOSIS — I5181 Takotsubo syndrome: Secondary | ICD-10-CM

## 2013-03-28 DIAGNOSIS — I10 Essential (primary) hypertension: Secondary | ICD-10-CM

## 2013-03-28 DIAGNOSIS — I2510 Atherosclerotic heart disease of native coronary artery without angina pectoris: Secondary | ICD-10-CM

## 2013-03-28 MED ORDER — METOPROLOL TARTRATE 25 MG PO TABS: ORAL_TABLET | ORAL | Status: DC

## 2013-03-28 NOTE — Patient Instructions (Signed)
Your physician has recommended you make the following change in your medication: Lopressor 25 mg take 1 & 1/2 tablet in the morning and 1 tablet in the PM.  Your physician recommends that you schedule a follow-up appointment in: 6 MONTHS.

## 2013-04-09 ENCOUNTER — Encounter: Payer: Self-pay | Admitting: Cardiovascular Disease

## 2013-04-09 DIAGNOSIS — I447 Left bundle-branch block, unspecified: Secondary | ICD-10-CM | POA: Insufficient documentation

## 2013-04-09 DIAGNOSIS — I5181 Takotsubo syndrome: Secondary | ICD-10-CM | POA: Insufficient documentation

## 2013-04-09 DIAGNOSIS — I10 Essential (primary) hypertension: Secondary | ICD-10-CM | POA: Insufficient documentation

## 2013-04-09 DIAGNOSIS — E785 Hyperlipidemia, unspecified: Secondary | ICD-10-CM | POA: Insufficient documentation

## 2013-04-09 DIAGNOSIS — E039 Hypothyroidism, unspecified: Secondary | ICD-10-CM | POA: Insufficient documentation

## 2013-04-09 NOTE — Progress Notes (Signed)
Patient ID: Shelby Brewer, female   DOB: 1946-04-02, 67 y.o.   MRN: [id number]     HPI: Shelby Brewer, is a 67 y.o. female who presents for a nine-month followup neurologic evaluation.  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 She had nonobstructive CAD and her LV dysfunction was out of proportion to her mild CAD. In the setting of her cardiomyopathy she did develop transient atrial fibrillation. She has documented chronic left bundle-branch block. An echo Doppler study in 2012 revealed hyperdynamic LV function with an ejection fraction now at 65%. There was grade 1 diastolic dysfunction with moderate mitral and tricuspid regurgitation. Additional problems include hypertension, hyperlipidemia, and hypothyroidism. She does note occasional palpitations.  She was supposed to be on metoprolol 50 mg in the morning and 25 mg at night but apparently she's only been taking this 25 mg twice a day. She presents for evaluation.  Past Medical History  Diagnosis Date  . 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 MI (myocardial infarction)   . Hypothyroid   . Anxiety   . Arthritis   . Diarrhea   . CAD (coronary artery disease)     She had nonobstructive CAD and her LV dysfunction was out of proportion to her coronary artery disease.  Marland Kitchen LBBB (left bundle branch block)     chronic  . Hypertension   . Hyperlipemia   . Hx of echocardiogram 06/2011    showed hyperdynamic LV function now with an EF of 65%, she had abnormal tissue doppler and grade 1 diastolic dysfunction with moderate MR and TR.  Marland Kitchen History of stress test 06/26/2010    compared to the previous study, there is no significant change. Normal myocardial perfusion study, this is a low risk scan.    Past Surgical History  Procedure Laterality Date  . Colonoscopy    . Left breast cystectomy    . Colonoscopy N/A 11/24/2012   VHQ:IONGEXB diverticulosis-status post segmental biopsies, NEGATIVE PATH.   . Cardiac catheterization  01/08/2009    mild coronary artery disease, notably in the left anterior descending and the right coronary artery. 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    No Known Allergies  Current Outpatient Prescriptions  Medication Sig Dispense Refill  . calcium-vitamin D (OSCAL WITH D) 500-200 MG-UNIT per tablet Take 1 tablet by mouth daily.      . clonazePAM (KLONOPIN) 1 MG tablet Take 1 mg by mouth daily.       Marland Kitchen dicyclomine (BENTYL) 10 MG capsule Take 1 capsule (10 mg total) by mouth 4 (four) times daily -  before meals and at bedtime.  360 capsule  3  . levothyroxine (SYNTHROID, LEVOTHROID) 50 MCG tablet Take 50 mcg by mouth daily.      . meloxicam (MOBIC) 7.5 MG tablet Take 7.5 mg by mouth daily as needed.       . Multiple Vitamin (MULTIVITAMIN WITH MINERALS) TABS Take 1 tablet by mouth daily.      . nitroGLYCERIN (NITROSTAT) 0.4 MG SL tablet Place 0.4 mg under the tongue every 5 (five) minutes as needed.      . simvastatin (ZOCOR) 20 MG tablet Take 20 mg by mouth every evening.      Marland Kitchen aspirin 81 MG tablet Take 81 mg by mouth daily as needed for pain.       Marland Kitchen  metoprolol tartrate (LOPRESSOR) 25 MG tablet Take 1 & 1/2 tablet in the AM and 1 tablet in the PM.  225 tablet  3   No current facility-administered medications for this visit.    History   Social History  . Marital Status: Married    Spouse Name: N/A    Number of Children: 3  . Years of Education: N/A   Occupational History  . RETIRED; Waitress    Social History Main Topics  . Smoking status: Never Smoker   . Smokeless tobacco: Not on file  . Alcohol Use: No  . Drug Use: No  . Sexually Active: Not on file   Other Topics Concern  . Not on file   Social History Narrative  . No narrative on file    Social history is notable that she is married has 3  children 4 grandchildren. Has no tobacco history.  ROS is negative for fevers, chills or night sweats.  She notes rare palpitations. She denies presyncope or syncope. She denies wheezing. She denies chest pressure. She denies abdominal complaints. She denies significant edema. She denies paresthesias.  Other system review is negative.  PE BP 140/88  Pulse 57  Ht 5\' 4"  (1.626 m)  Wt 126 lb 12.8 oz (57.516 kg)  BMI 21.75 kg/m2  Repeat blood pressure was 158/88 General: Alert, oriented, no distress.  Skin: normal turgor, no rashes HEENT: Normocephalic, atraumatic. Pupils round and reactive; sclera anicteric;no lid lag.  Nose without nasal septal hypertrophy Mouth/Parynx benign; Mallinpatti scale 2 Neck: No JVD, no carotid briuts Lungs: clear to ausculatation and percussion; no wheezing or rales Heart: RRR, s1 s2 normal 2/6 systolic murmur compatible with her mitral regurgitation at the apex and 1/6 murmur right sternal border compatible with her tricuspid regurgitation Abdomen: soft, nontender; no hepatosplenomehaly, BS+; abdominal aorta nontender and not dilated by palpation. Pulses 2+ Extremities: no clubbing cyanosis or edema, Homan's sign negative  Neurologic: grossly nonfocal  ECG: Sinus rhythm at 57 beats per minute with left bundle branch block and associated repolarization changes   LABS:  BMET    Component Value Date/Time   NA 140 11/17/2012 0945   K 4.8 11/17/2012 0945   CL 103 11/17/2012 0945   CO2 31 11/17/2012 0945   GLUCOSE 88 11/17/2012 0945   BUN 18 11/17/2012 0945   CREATININE 0.71 11/17/2012 0945   CREATININE 0.69 01/15/2009 0430   CALCIUM 9.7 11/17/2012 0945   GFRNONAA >60 01/15/2009 0430   GFRAA  Value: >60        The eGFR has been calculated using the MDRD equation. This calculation has not been validated in all clinical situations. eGFR's persistently <60 mL/min signify possible Chronic Kidney Disease. 01/15/2009 0430     Hepatic Function Panel     Component  Value Date/Time   PROT 7.2 11/17/2012 0945   ALBUMIN 4.8 11/17/2012 0945   AST 25 11/17/2012 0945   ALT 19 11/17/2012 0945   ALKPHOS 66 11/17/2012 0945   BILITOT 0.4 11/17/2012 0945     CBC    Component Value Date/Time   WBC 5.5 11/17/2012 0945   RBC 4.40 11/17/2012 0945   HGB 13.6 11/17/2012 0945   HCT 39.4 11/17/2012 0945   PLT 236 11/17/2012 0945   MCV 89.5 11/17/2012 0945   MCH 30.9 11/17/2012 0945   MCHC 34.5 11/17/2012 0945   RDW 13.7 11/17/2012 0945   LYMPHSABS 1.7 11/17/2012 0945   MONOABS 0.6 11/17/2012 0945   EOSABS 0.2 11/17/2012 0945  BASOSABS 0.1 11/17/2012 0945     BNP    Component Value Date/Time   PROBNP 653.0* 01/10/2009 0445    Lipid Panel  No results found for this basename: chol, trig, hdl, cholhdl, vldl, ldlcalc     RADIOLOGY: No results found.    ASSESSMENT AND PLAN: Shelby Brewer has a history of Takotsubo cardiomyopathy with an initial ejection fraction of 30-35% in May 2010.  LV function has since normalized. Her blood pressure today was still elevated at 158/88. I'm recommending slight additional titration of her metoprolol tartrate to 37.5 mg in the morning and 25 mg at night. This should also help with some of her sensation of palpitations. I will see her in 6 months followup evaluation or sooner if problems arise.     Lennette Bihari, MD, [facility]  04/09/2013 11:15 AM

## 2013-05-30 ENCOUNTER — Encounter: Payer: Self-pay | Admitting: Cardiovascular Disease

## 2013-05-30 ENCOUNTER — Ambulatory Visit (INDEPENDENT_AMBULATORY_CARE_PROVIDER_SITE_OTHER): Payer: Medicare Other | Admitting: Cardiovascular Disease

## 2013-05-30 VITALS — BP 122/68 | HR 60 | Ht 64.0 in | Wt 127.2 lb

## 2013-05-30 DIAGNOSIS — I447 Left bundle-branch block, unspecified: Secondary | ICD-10-CM

## 2013-05-30 DIAGNOSIS — I4510 Unspecified right bundle-branch block: Secondary | ICD-10-CM

## 2013-05-30 DIAGNOSIS — I2510 Atherosclerotic heart disease of native coronary artery without angina pectoris: Secondary | ICD-10-CM

## 2013-05-30 DIAGNOSIS — I10 Essential (primary) hypertension: Secondary | ICD-10-CM

## 2013-05-30 DIAGNOSIS — E785 Hyperlipidemia, unspecified: Secondary | ICD-10-CM

## 2013-05-30 MED ORDER — NITROGLYCERIN 0.4 MG SL SUBL: 0.4000 mg | SUBLINGUAL_TABLET | SUBLINGUAL | Status: DC | PRN

## 2013-05-30 NOTE — Progress Notes (Signed)
Patient ID: Shelby Brewer, female   DOB: [birth year], 68 y.o.   MRN: [id number]     HPI: Shelby Brewer, is a 67 y.o. female who presents for one-year cardiology evaluation.  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 Cardiac catheterization revealed mild 30% narrowings in both her LAD and right current artery and there LV dysfunction was out of proportion to her CAD. At that time, she also developed transient atrial fibrillation in the setting of her cardiomyopathy. She has chronic left bundle branch block, hypertension, hyperlipidemia, hypothyroidism, and previously documented moderate MR and TR. Her last echo Doppler study in October 2012 showed hyperdynamic LV function with an EF of 65%. She had grade 1 diastolic dysfunction  Recently, she has noticed episodes of chest pressure in the left upper chest radiating to her shoulder. This oftentimes is nonexertional. She does admit to being under increased stress. She presents for evaluation.  Past Medical History  Diagnosis Date  . 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 MI (myocardial infarction)   . Hypothyroid   . Anxiety   . Arthritis   . Diarrhea   . CAD (coronary artery disease)     She had nonobstructive CAD and her LV dysfunction was out of proportion to her coronary artery disease.  Marland Kitchen LBBB (left bundle branch block)     chronic  . Hypertension   . Hyperlipemia   . Hx of echocardiogram 06/2011    showed hyperdynamic LV function now with an EF of 65%, she had abnormal tissue doppler and grade 1 diastolic dysfunction with moderate MR and TR.  Marland Kitchen History of stress test 06/26/2010    compared to the previous study, there is no significant change. Normal myocardial perfusion study, this is a low risk scan.    Past Surgical History  Procedure Laterality Date  . Colonoscopy      . Left breast cystectomy    . Colonoscopy N/A 11/24/2012    [id number]:[id number] diverticulosis-status post segmental biopsies, NEGATIVE PATH.   . Cardiac catheterization  01/08/2009    mild coronary artery disease, notably in the left anterior descending and the right coronary artery. 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    No Known Allergies  Current Outpatient Prescriptions  Medication Sig Dispense Refill  . calcium-vitamin D (OSCAL WITH D) 500-200 MG-UNIT per tablet Take 1 tablet by mouth daily.      . clonazePAM (KLONOPIN) 1 MG tablet Take 1 mg by mouth daily as needed.       Marland Kitchen levothyroxine (SYNTHROID, LEVOTHROID) 50 MCG tablet Take 50 mcg by mouth daily.      . meloxicam (MOBIC) 7.5 MG tablet Take 7.5 mg by mouth daily as needed.       . metoprolol tartrate (LOPRESSOR) 25 MG tablet Take 1 & 1/2 tablet in the AM and 1 tablet in the PM.  225 tablet  3  . Multiple Vitamin (MULTIVITAMIN WITH MINERALS) TABS Take 1 tablet by mouth daily.      . simvastatin (ZOCOR) 20 MG tablet Take 20 mg by mouth every evening.      . nitroGLYCERIN (NITROSTAT) 0.4 MG SL tablet Place 1 tablet (0.4 mg total) under the tongue every 5 (five) minutes as needed.  25 tablet  3   No current  facility-administered medications for this visit.    History   Social History  . Marital Status: Married    Spouse Name: N/A    Number of Children: 3  . Years of Education: N/A   Occupational History  . RETIRED; Waitress    Social History Main Topics  . Smoking status: Never Smoker   . Smokeless tobacco: Not on file  . Alcohol Use: No  . Drug Use: No  . Sexual Activity: Not on file   Other Topics Concern  . Not on file   Social History Narrative  . No narrative on file    Family History  Problem Relation Age of Onset  . Lymphoma Mother   . Lymphoma Daughter   . Colon cancer Neg Hx    Social she is married has 3 children 4 grandchildren. No  tobacco history. She does not routinely exercise. No alcohol history.  ROS is negative for fevers, chills or night sweats. She is unaware of palpitations. She denies presyncope. She does notice left-sided chest pressure which is not typically exertionally precipitated. She denies heavy lifting she denies bleeding. She denies abdominal pain. She denies myalgias. She denies claudication.   Other system review is negative.  PE BP 122/68  Pulse 60  Ht 5\' 4"  (1.626 m)  Wt 127 lb 3.2 oz (57.698 kg)  BMI 21.82 kg/m2  General: Alert, oriented, no distress.  Skin: normal turgor, no rashes HEENT: Normocephalic, atraumatic. Pupils round and reactive; sclera anicteric;no lid lag.  Nose without nasal septal hypertrophy Mouth/Parynx benign; Mallinpatti scale 2 Neck: No JVD, no carotid briuts Lungs: clear to ausculatation and percussion; no wheezing or rales Heart: RRR, s1 s2 normal 1/6 sem Abdomen: soft, nontender; no hepatosplenomehaly, BS+; abdominal aorta nontender and not dilated by palpation. Pulses 2+ Extremities: no clubbing cyanosis or edema, Homan's sign negative  Neurologic: grossly nonfocal  ECG: Sinus rhythm at 60 with left bundle-branch block and associated repolarization changes, unchanged  LABS:  BMET    Component Value Date/Time   NA 140 11/17/2012 0945   K 4.8 11/17/2012 0945   CL 103 11/17/2012 0945   CO2 31 11/17/2012 0945   GLUCOSE 88 11/17/2012 0945   BUN 18 11/17/2012 0945   CREATININE 0.71 11/17/2012 0945   CREATININE 0.69 01/15/2009 0430   CALCIUM 9.7 11/17/2012 0945   GFRNONAA >60 01/15/2009 0430   GFRAA  Value: >60        The eGFR has been calculated using the MDRD equation. This calculation has not been validated in all clinical situations. eGFR's persistently <60 mL/min signify possible Chronic Kidney Disease. 01/15/2009 0430     Hepatic Function Panel     Component Value Date/Time   PROT 7.2 11/17/2012 0945   ALBUMIN 4.8 11/17/2012 0945   AST 25 11/17/2012 0945    ALT 19 11/17/2012 0945   ALKPHOS 66 11/17/2012 0945   BILITOT 0.4 11/17/2012 0945     CBC    Component Value Date/Time   WBC 5.5 11/17/2012 0945   RBC 4.40 11/17/2012 0945   HGB 13.6 11/17/2012 0945   HCT 39.4 11/17/2012 0945   PLT 236 11/17/2012 0945   MCV 89.5 11/17/2012 0945   MCH 30.9 11/17/2012 0945   MCHC 34.5 11/17/2012 0945   RDW 13.7 11/17/2012 0945   LYMPHSABS 1.7 11/17/2012 0945   MONOABS 0.6 11/17/2012 0945   EOSABS 0.2 11/17/2012 0945   BASOSABS 0.1 11/17/2012 0945     BNP    Component Value Date/Time  PROBNP 653.0* 01/10/2009 0445    Lipid Panel  No results found for this basename: chol, trig, hdl, cholhdl, vldl, ldlcalc     RADIOLOGY: No results found.    ASSESSMENT AND PLAN: Ms. Ayris Carano is now 67 years old. She has a remote history of takusubo cardiomyopathy with initial ejection fraction of 30-35% over 4 years ago. Cardiac catheterization at that time showed scattered 30% lesions in her LAD in 20-30% the right coronary artery. Her ejection fraction has normalized to being hyperdynamic at her last echo. She recently has experienced left-sided chest pain which is somewhat atypical. However, she also describes it as a tightness with radiation to her left shoulder. Her blood pressure is well controlled on current therapy. She is on simvastatin for her lipids. She is on Synthroid for hypothyroidism. I am recommending she undergo a LexiScan Myoview study for further evaluation of this chest pain. She does have left bundle branch block and therefore this cannot be done in an exercise protocol. If her study is abnormal I will see her shortly after her study. Otherwise I will see her in 6 months for followup evaluation.     Lennette Bihari, MD, [facility]  05/30/2013 8:01 PM

## 2013-05-30 NOTE — Patient Instructions (Addendum)
Your physician has requested that you have a lexiscan myoview. For further information please visit [email or web address]. Please follow instruction sheet, as given.  Your physician recommends that you schedule a follow-up appointment in 6 months. 

## 2013-06-09 ENCOUNTER — Other Ambulatory Visit (HOSPITAL_COMMUNITY): Payer: Medicare Other

## 2013-06-13 ENCOUNTER — Other Ambulatory Visit: Payer: Self-pay | Admitting: *Deleted

## 2013-06-13 DIAGNOSIS — I4510 Unspecified right bundle-branch block: Secondary | ICD-10-CM

## 2013-06-14 ENCOUNTER — Inpatient Hospital Stay (HOSPITAL_COMMUNITY): Admission: RE | Admit: 2013-06-14 | Payer: Medicare Other | Source: Ambulatory Visit

## 2013-06-14 ENCOUNTER — Encounter (HOSPITAL_COMMUNITY): Payer: Medicare Other

## 2013-06-19 ENCOUNTER — Telehealth: Payer: Self-pay | Admitting: Cardiovascular Disease

## 2013-06-19 ENCOUNTER — Telehealth: Payer: Self-pay

## 2013-06-19 NOTE — Telephone Encounter (Signed)
Forwarded to Dr. Kelly.

## 2013-06-19 NOTE — Telephone Encounter (Signed)
She wanted Dr Tresa Endo to know that she had cancelled her stress test on 06-26-2013.Said she had death in the family.

## 2013-10-31 NOTE — Telephone Encounter (Signed)
Opened in error

## 2013-11-06 ENCOUNTER — Other Ambulatory Visit (HOSPITAL_COMMUNITY): Payer: Self-pay | Admitting: Family Medicine

## 2013-11-06 DIAGNOSIS — Z139 Encounter for screening, unspecified: Secondary | ICD-10-CM

## 2013-11-09 ENCOUNTER — Ambulatory Visit (HOSPITAL_COMMUNITY)
Admission: RE | Admit: 2013-11-09 | Discharge: 2013-11-09 | Disposition: A | Discharge status: Home / Self Care | Payer: Medicare Other | Source: Ambulatory Visit | Attending: Family Medicine | Admitting: Family Medicine

## 2013-11-09 DIAGNOSIS — Z1231 Encounter for screening mammogram for malignant neoplasm of breast: Secondary | ICD-10-CM | POA: Insufficient documentation

## 2013-11-09 DIAGNOSIS — Z139 Encounter for screening, unspecified: Secondary | ICD-10-CM

## 2013-11-14 ENCOUNTER — Encounter (HOSPITAL_COMMUNITY): Payer: Medicare Other

## 2013-11-14 ENCOUNTER — Encounter (HOSPITAL_COMMUNITY): Admission: RE | Admit: 2013-11-14 | Payer: Medicare Other | Source: Ambulatory Visit

## 2013-11-14 ENCOUNTER — Inpatient Hospital Stay (HOSPITAL_COMMUNITY): Admission: RE | Admit: 2013-11-14 | Payer: Medicare Other | Source: Ambulatory Visit

## 2013-11-20 ENCOUNTER — Encounter (HOSPITAL_COMMUNITY): Payer: Self-pay

## 2013-11-20 ENCOUNTER — Encounter (HOSPITAL_COMMUNITY)
Admission: RE | Admit: 2013-11-20 | Discharge: 2013-11-20 | Disposition: A | Discharge status: Home / Self Care | Payer: Medicare Other | Source: Ambulatory Visit | Attending: Cardiovascular Disease | Admitting: Cardiovascular Disease

## 2013-11-20 DIAGNOSIS — I4510 Unspecified right bundle-branch block: Secondary | ICD-10-CM | POA: Insufficient documentation

## 2013-11-20 DIAGNOSIS — I2510 Atherosclerotic heart disease of native coronary artery without angina pectoris: Secondary | ICD-10-CM

## 2013-11-20 MED ORDER — SODIUM CHLORIDE 0.9 % IJ SOLN
INTRAMUSCULAR | Status: AC
  Administered 2013-11-20: 10 mL via INTRAVENOUS
  Filled 2013-11-20: qty 10

## 2013-11-20 MED ORDER — TECHNETIUM TC 99M SESTAMIBI GENERIC - CARDIOLITE
30.0000 | Freq: Once | INTRAVENOUS | Status: AC | PRN
  Administered 2013-11-20: 30 via INTRAVENOUS

## 2013-11-20 MED ORDER — TECHNETIUM TC 99M SESTAMIBI - CARDIOLITE
10.0000 | Freq: Once | INTRAVENOUS | Status: AC | PRN
  Administered 2013-11-20: 10 via INTRAVENOUS

## 2013-11-20 MED ORDER — REGADENOSON 0.4 MG/5ML IV SOLN
INTRAVENOUS | Status: AC
  Administered 2013-11-20: 0.4 mg via INTRAVENOUS
  Filled 2013-11-20: qty 5

## 2013-11-20 NOTE — Progress Notes (Signed)
Stress Lab Nurses Notes - Shelby Brewer  Shelby CiproJoan W Brewer 11/20/2013 Reason for doing test: CAD and chest pressure Type of test: Marlane HatcherLexiscan Cardiolite Nurse performing test: Parke PoissonPhyllis Billingsly, RN Nuclear Medicine Tech: [name] Echo Tech: Not Applicable MD performing test: Koneswaran/K.Lawrence NP Family MD: McGough Test explained and consent signed: yes IV started: 22g jelco, Saline lock flushed, No redness or edema and Saline lock started in radiology Symptoms: Legs weakness & Nausea Treatment/Intervention: None Reason test stopped: protocol completed After recovery IV was: Discontinued via X-ray tech and No redness or edema Patient to return to Nuc. Med at : 10:15 Patient discharged: Home Patient's Condition upon discharge was: stable Comments: During test BP 127/80 & HR 94.  Recovery BP 119/79 & HR 80.  Symptoms resolved in recovery.  Erskine SpeedBillingsley, [name]

## 2013-12-06 ENCOUNTER — Encounter: Payer: Self-pay | Admitting: *Deleted

## 2014-05-16 ENCOUNTER — Encounter: Payer: Self-pay | Admitting: Cardiovascular Disease

## 2014-05-16 ENCOUNTER — Ambulatory Visit (INDEPENDENT_AMBULATORY_CARE_PROVIDER_SITE_OTHER): Payer: Medicare Other | Admitting: Cardiovascular Disease

## 2014-05-16 VITALS — BP 127/83 | HR 57 | Ht 64.0 in | Wt 122.0 lb

## 2014-05-16 DIAGNOSIS — E039 Hypothyroidism, unspecified: Secondary | ICD-10-CM

## 2014-05-16 DIAGNOSIS — I480 Paroxysmal atrial fibrillation: Secondary | ICD-10-CM

## 2014-05-16 DIAGNOSIS — I4891 Unspecified atrial fibrillation: Secondary | ICD-10-CM

## 2014-05-16 DIAGNOSIS — I5181 Takotsubo syndrome: Secondary | ICD-10-CM

## 2014-05-16 DIAGNOSIS — I2510 Atherosclerotic heart disease of native coronary artery without angina pectoris: Secondary | ICD-10-CM

## 2014-05-16 DIAGNOSIS — I10 Essential (primary) hypertension: Secondary | ICD-10-CM

## 2014-05-16 DIAGNOSIS — I447 Left bundle-branch block, unspecified: Secondary | ICD-10-CM

## 2014-05-16 DIAGNOSIS — E785 Hyperlipidemia, unspecified: Secondary | ICD-10-CM

## 2014-05-16 NOTE — Progress Notes (Signed)
Patient ID: Shelby Brewer, female   DOB: 1946/07/08, 68 y.o.   MRN: [id number]      HPI: Shelby Brewer is a 68 y.o. female who presents for one-year cardiology evaluation.  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 Cardiac catheterization revealed mild 30% narrowings in both her LAD and RCA and there LV dysfunction was out of proportion to her CAD. At that time, she also developed transient atrial fibrillation in the setting of her cardiomyopathy. She has chronic left bundle branch block, hypertension, hyperlipidemia, hypothyroidism, and previously documented moderate MR and TR. Her last echo Doppler study in October 2012 showed hyperdynamic LV function with an EF of 65%. She had grade 1 diastolic dysfunction.  Last year she had noticed episodes of chest pressure in the left upper chest radiating to her shoulder. This oftentimes is nonexertional. She does admit to being under increased stress.  She underwent a nuclear perfusion study in [facility] in March 2015. She had a normal LexiScan study without evidence for myocardial ischemia or scar.  Post stress ejection fraction was 77%.  He tells me that her husband died in 09-25-2022.  She has stopped taking her simvastatin.  She has been taking fish oil for hyperlipidemia.  She also has had difficulty with her left shoulder for which she has taken meloxicam.  She did undergo recent laboratory by Dr. Herby Brewer in May 2015.  This was reviewed by me today consisting of a CBC and chemistry profile.  Renal function was normal with a creatinine of 0.64.  At that time, her cholesterol was high at 225, LDL cholesterol was 143.  I did not get his blood work back until she left the office.  Past Medical History  Diagnosis Date  . 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 MI (myocardial infarction)   .  Hypothyroid   . Anxiety   . Arthritis   . Diarrhea   . CAD (coronary artery disease)     She had nonobstructive CAD and her LV dysfunction was out of proportion to her coronary artery disease.  Marland Kitchen LBBB (left bundle branch block)     chronic  . Hypertension   . Hyperlipemia   . Hx of echocardiogram 06/2011    showed hyperdynamic LV function now with an EF of 65%, she had abnormal tissue doppler and grade 1 diastolic dysfunction with moderate MR and TR.  Marland Kitchen History of stress test 06/26/2010    compared to the previous study, there is no significant change. Normal myocardial perfusion study, this is a low risk scan.    Past Surgical History  Procedure Laterality Date  . Colonoscopy    . Left breast cystectomy    . Colonoscopy N/A 11/24/2012    [id number] diverticulosis-status post segmental biopsies, NEGATIVE PATH.   . Cardiac catheterization  01/08/2009    mild coronary artery disease, notably in the left anterior descending and the right coronary artery. 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    No Known Allergies  Current Outpatient Prescriptions  Medication Sig Dispense Refill  . calcium-vitamin D (OSCAL WITH D) 500-200 MG-UNIT per tablet Take 1 tablet by mouth daily.      . clonazePAM (KLONOPIN) 1 MG tablet Take 1 mg by mouth daily as needed.       Marland Kitchen  levothyroxine (SYNTHROID, LEVOTHROID) 50 MCG tablet Take 50 mcg by mouth daily.      . meloxicam (MOBIC) 7.5 MG tablet Take 7.5 mg by mouth daily as needed.       . metoprolol tartrate (LOPRESSOR) 25 MG tablet Take 1 & 1/2 tablet in the AM and 1 tablet in the PM.  225 tablet  3  . Multiple Vitamin (MULTIVITAMIN WITH MINERALS) TABS Take 1 tablet by mouth daily.      . nitroGLYCERIN (NITROSTAT) 0.4 MG SL tablet Place 1 tablet (0.4 mg total) under the tongue every 5 (five) minutes as needed.  25 tablet  3  . simvastatin (ZOCOR) 20 MG tablet Take 20 mg by mouth every  evening.       No current facility-administered medications for this visit.    History   Social History  . Marital Status: Married    Spouse Name: N/A    Number of Children: 3  . Years of Education: N/A   Occupational History  . RETIRED; Waitress    Social History Main Topics  . Smoking status: Never Smoker   . Smokeless tobacco: Not on file  . Alcohol Use: No  . Drug Use: No  . Sexual Activity: Not on file   Other Topics Concern  . Not on file   Social History Narrative  . No narrative on file    Family History  Problem Relation Age of Onset  . Lymphoma Mother   . Lymphoma Daughter   . Colon cancer Neg Hx    Social she is married has 3 children 4 grandchildren. No tobacco history. She does not routinely exercise. No alcohol history.  ROS General: Negative; No fevers, chills, or night sweats;  HEENT: Negative; No changes in vision or hearing, sinus congestion, difficulty swallowing Pulmonary: Negative; No cough, wheezing, shortness of breath, hemoptysis Cardiovascular: Negative; No chest pain, presyncope, syncope, palpitations GI: Negative; No nausea, vomiting, diarrhea, or abdominal pain GU: Negative; No dysuria, hematuria, or difficulty voiding Musculoskeletal: Left shoulder discomfort  Hematologic/Oncology: Negative; no easy bruising, bleeding Endocrine: Negative; no heat/cold intolerance; no diabetes Neuro: Negative; no changes in balance, headaches Skin: Negative; No rashes or skin lesions Psychiatric: Negative; No behavioral problems, depression Sleep: Negative; No snoring, daytime sleepiness, hypersomnolence, bruxism, restless legs, hypnogognic hallucinations, no cataplexy Other comprehensive 14 point system review is negative.   PE BP 127/83  Pulse 57  Ht 5' 4" (1.626 m)  Wt 122 lb (55.339 kg)  BMI 20.93 kg/m2  General: Alert, oriented, no distress.  Skin: normal turgor, no rashes HEENT: Normocephalic, atraumatic. Pupils round and reactive;  sclera anicteric;no lid lag.  Nose without nasal septal hypertrophy Mouth/Parynx benign; Mallinpatti scale 2 Neck: No JVD, no carotid bruits with normal carotid upstroke Lungs: clear to ausculatation and percussion; no wheezing or rales Heart: RRR, s1 s2 normal 1/6 sem; no diastolic murmur.  No S3 or S4 gallop.  No rubs, thrills or heaves. Abdomen: soft, nontender; no hepatosplenomehaly, BS+; abdominal aorta nontender and not dilated by palpation. Back: No CVA tenderness Pulses 2+ Extremities: no clubbing cyanosis or edema, Homan's sign negative  Neurologic: grossly nonfocal  ECG (independently read by me): Sinus bradycardia 57 beats per minute.  Left bundle branch block with repolarization changes.  05/30/2013 ECG: Sinus rhythm at 60 with left bundle-branch block and associated repolarization changes, unchanged  LABS:  BMET    Component Value Date/Time   NA 140 11/17/2012 0945   K 4.8 11/17/2012 0945   CL 103 11/17/2012  0945   CO2 31 11/17/2012 0945   GLUCOSE 88 11/17/2012 0945   BUN 18 11/17/2012 0945   CREATININE 0.71 11/17/2012 0945   CREATININE 0.69 01/15/2009 0430   CALCIUM 9.7 11/17/2012 0945   GFRNONAA >60 01/15/2009 0430   GFRAA  Value: >60        The eGFR has been calculated using the MDRD equation. This calculation has not been validated in all clinical situations. eGFR's persistently <60 mL/min signify possible Chronic Kidney Disease. 01/15/2009 0430     Hepatic Function Panel     Component Value Date/Time   PROT 7.2 11/17/2012 0945   ALBUMIN 4.8 11/17/2012 0945   AST 25 11/17/2012 0945   ALT 19 11/17/2012 0945   ALKPHOS 66 11/17/2012 0945   BILITOT 0.4 11/17/2012 0945     CBC    Component Value Date/Time   WBC 5.5 11/17/2012 0945   RBC 4.40 11/17/2012 0945   HGB 13.6 11/17/2012 0945   HCT 39.4 11/17/2012 0945   PLT 236 11/17/2012 0945   MCV 89.5 11/17/2012 0945   MCH 30.9 11/17/2012 0945   MCHC 34.5 11/17/2012 0945   RDW 13.7 11/17/2012 0945   LYMPHSABS 1.7 11/17/2012  0945   MONOABS 0.6 11/17/2012 0945   EOSABS 0.2 11/17/2012 0945   BASOSABS 0.1 11/17/2012 0945     BNP    Component Value Date/Time   PROBNP 653.0* 01/10/2009 0445    Lipid Panel  No results found for this basename: chol,  trig,  hdl,  cholhdl,  vldl,  ldlcalc     RADIOLOGY: No results found.    ASSESSMENT AND PLAN:  Shelby Brewer is a 68 year old female, who has a remote history of takusubo cardiomyopathy with initial ejection fraction of 30-35% over 5 years ago. Cardiac catheterization at that time showed scattered 30% lesions in her LAD in 20-30% the right coronary artery. Her ejection fraction has normalized to being hyperdynamic at her last echo. She recently has experienced left-sided chest pain which is somewhat atypical.  Her most recent nuclear perfusion study continued to show normal perfusion and her ejection fraction post dermatologic stress was 77%.  She is not have any chest wall pain on exam.  Her blood pressure today is controlled.  She is taking metoprolol tartrate 37.5 mg in the morning and 25 mg at night.  Her thyroid disease is stable on Synthroid 50 mcg.  I did not have the results of her blood work when I had seen her.  However, her LDL cholesterol at that time was elevated.  I am not certain if she had stopped her simvastatin prior to the laboratory being done or after.  She refuses to take statins secondary to concerns of myalgias and joint issues.  She may be a candidate for Zetia 10 mg and will have Dr. Orson Ape to reassess.  I will see her in one year for reevaluation.   Troy Sine, MD, [facility]  05/16/2014 6:14 PM

## 2014-05-16 NOTE — Patient Instructions (Signed)
Your physician wants you to follow-up in: 1 year. You will receive a reminder letter in the mail two months in advance. If you don't receive a letter, please call our office to schedule the follow-up appointment.  

## 2014-07-17 ENCOUNTER — Other Ambulatory Visit: Payer: Self-pay | Admitting: *Deleted

## 2014-07-17 MED ORDER — METOPROLOL TARTRATE 25 MG PO TABS: ORAL_TABLET | ORAL | Status: DC

## 2014-07-17 NOTE — Telephone Encounter (Signed)
Refilled electronically 

## 2014-10-23 ENCOUNTER — Other Ambulatory Visit (HOSPITAL_COMMUNITY): Payer: Self-pay | Admitting: Physician Assistant

## 2014-10-23 DIAGNOSIS — M79605 Pain in left leg: Secondary | ICD-10-CM

## 2014-10-24 ENCOUNTER — Ambulatory Visit (HOSPITAL_COMMUNITY)
Admission: RE | Admit: 2014-10-24 | Discharge: 2014-10-24 | Disposition: A | Discharge status: Home / Self Care | Payer: Medicare Other | Source: Ambulatory Visit | Attending: Physician Assistant | Admitting: Physician Assistant

## 2014-10-24 DIAGNOSIS — M79605 Pain in left leg: Secondary | ICD-10-CM | POA: Diagnosis present

## 2014-10-29 ENCOUNTER — Other Ambulatory Visit (HOSPITAL_COMMUNITY): Payer: Self-pay | Admitting: Physician Assistant

## 2014-10-29 DIAGNOSIS — Z1231 Encounter for screening mammogram for malignant neoplasm of breast: Secondary | ICD-10-CM

## 2014-11-12 ENCOUNTER — Ambulatory Visit (HOSPITAL_COMMUNITY)
Admission: RE | Admit: 2014-11-12 | Discharge: 2014-11-12 | Disposition: A | Discharge status: Home / Self Care | Payer: Medicare Other | Source: Ambulatory Visit | Attending: Physician Assistant | Admitting: Physician Assistant

## 2014-11-12 DIAGNOSIS — Z1231 Encounter for screening mammogram for malignant neoplasm of breast: Secondary | ICD-10-CM

## 2014-11-14 ENCOUNTER — Ambulatory Visit (HOSPITAL_COMMUNITY)
Admission: RE | Admit: 2014-11-14 | Discharge: 2014-11-14 | Disposition: A | Discharge status: Home / Self Care | Payer: Medicare Other | Source: Ambulatory Visit | Attending: Physician Assistant | Admitting: Physician Assistant

## 2014-11-14 DIAGNOSIS — Z1231 Encounter for screening mammogram for malignant neoplasm of breast: Secondary | ICD-10-CM | POA: Diagnosis present

## 2014-11-16 ENCOUNTER — Ambulatory Visit (HOSPITAL_COMMUNITY): Payer: Medicare Other

## 2015-05-21 ENCOUNTER — Ambulatory Visit (INDEPENDENT_AMBULATORY_CARE_PROVIDER_SITE_OTHER): Payer: Medicare Other | Admitting: Cardiovascular Disease

## 2015-05-21 VITALS — BP 98/70 | HR 67 | Ht 64.0 in | Wt 119.2 lb

## 2015-05-21 DIAGNOSIS — I2510 Atherosclerotic heart disease of native coronary artery without angina pectoris: Secondary | ICD-10-CM

## 2015-05-21 DIAGNOSIS — I10 Essential (primary) hypertension: Secondary | ICD-10-CM

## 2015-05-21 DIAGNOSIS — I447 Left bundle-branch block, unspecified: Secondary | ICD-10-CM | POA: Diagnosis not present

## 2015-05-21 DIAGNOSIS — Z79899 Other long term (current) drug therapy: Secondary | ICD-10-CM | POA: Diagnosis not present

## 2015-05-21 DIAGNOSIS — I5181 Takotsubo syndrome: Secondary | ICD-10-CM | POA: Diagnosis not present

## 2015-05-21 DIAGNOSIS — E785 Hyperlipidemia, unspecified: Secondary | ICD-10-CM

## 2015-05-21 DIAGNOSIS — I2583 Coronary atherosclerosis due to lipid rich plaque: Secondary | ICD-10-CM

## 2015-05-21 DIAGNOSIS — E039 Hypothyroidism, unspecified: Secondary | ICD-10-CM

## 2015-05-21 NOTE — Patient Instructions (Signed)
Your physician has requested that you have an echocardiogram. Echocardiography is a painless test that uses sound waves to create images of your heart. It provides your doctor with information about the size and shape of your heart and how well your heart's chambers and valves are working. This procedure takes approximately one hour. There are no restrictions for this procedure. THIS WILL BE DONE IN 6 MONTHS.  Your physician wants you to follow-up in: 6 months or sooner if needed. You will receive a reminder letter in the mail two months in advance. If you don't receive a letter, please call our office to schedule the follow-up appointment.  Your physician recommends that you return for lab work in: 3 months fasting.

## 2015-05-22 ENCOUNTER — Encounter: Payer: Self-pay | Admitting: Cardiovascular Disease

## 2015-05-22 NOTE — Progress Notes (Signed)
Patient ID: Shelby Brewer, female   DOB: Feb 27, 1946, 69 y.o.   MRN: [id number]     HPI: Shelby Brewer is a 69 y.o. female who presents for one-year cardiology evaluation.  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 Cardiac catheterization revealed mild 30% narrowings in both her LAD and RCA and there LV dysfunction was out of proportion to her CAD. At that time, she also developed transient atrial fibrillation in the setting of her cardiomyopathy. She has chronic left bundle branch block, hypertension, hyperlipidemia, hypothyroidism, and previously documented moderate MR and TR. Her last echo Doppler study in October 2012 showed hyperdynamic LV function with an EF of 65%. She had grade 1 diastolic dysfunction.  Two years ago she had noticed episodes of nonexertionalchest pressure in the left upper chest radiating to her shoulder. She does admit to being under increased stress.  She underwent a nuclear perfusion study in [facility] in March 2015. She had a normal LexiScan study without evidence for myocardial ischemia or scar.  Post stress ejection fraction was 77%.  Her husband died in 2013/10/30.  She has had some arthritic issues for which she has taken Mobitz for her shoulder.  Over the past year, she denies any exertionally precipitated chest pain.  She has stopped taking her simvastatin.  She has been on metoprolol, tartrate 37.5 mrem in the morning and 25 mg at night.  She has hypothyroidism on Synthroid 50 g.  She denies PND or orthopnea.  She denies palpitations.  She presents for evaluation.   Past Medical History  Diagnosis Date  . 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 MI (myocardial infarction)   . Hypothyroid   . Anxiety   . Arthritis   . Diarrhea   . CAD (coronary artery disease)     She had nonobstructive CAD and her LV dysfunction was out of proportion to  her coronary artery disease.  Marland Kitchen LBBB (left bundle branch block)     chronic  . Hypertension   . Hyperlipemia   . Hx of echocardiogram 06/2011    showed hyperdynamic LV function now with an EF of 65%, she had abnormal tissue doppler and grade 1 diastolic dysfunction with moderate MR and TR.  Marland Kitchen History of stress test 06/26/2010    compared to the previous study, there is no significant change. Normal myocardial perfusion study, this is a low risk scan.    Past Surgical History  Procedure Laterality Date  . Colonoscopy    . Left breast cystectomy    . Colonoscopy N/A 11/24/2012    [id number] diverticulosis-status post segmental biopsies, NEGATIVE PATH.   . Cardiac catheterization  01/08/2009    mild coronary artery disease, notably in the left anterior descending and the right coronary artery. 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    No Known Allergies  Current Outpatient Prescriptions  Medication Sig Dispense Refill  . calcium-vitamin D (OSCAL WITH D) 500-200 MG-UNIT per tablet Take 1 tablet by mouth daily.    . clonazePAM (KLONOPIN) 1 MG tablet Take 1 mg by mouth daily as needed.     Marland Kitchen levothyroxine (SYNTHROID, LEVOTHROID) 50 MCG tablet Take 50 mcg by mouth daily.    . meloxicam (MOBIC) 7.5 MG tablet Take 7.5 mg by mouth daily as needed.     . metoprolol tartrate (  LOPRESSOR) 25 MG tablet Take 1 & 1/2 tablet in the AM and 1 tablet in the PM. 225 tablet 3  . Multiple Vitamin (MULTIVITAMIN WITH MINERALS) TABS Take 1 tablet by mouth daily.    . nitroGLYCERIN (NITROSTAT) 0.4 MG SL tablet Place 1 tablet (0.4 mg total) under the tongue every 5 (five) minutes as needed. 25 tablet 3  . simvastatin (ZOCOR) 20 MG tablet Take 20 mg by mouth every evening.     No current facility-administered medications for this visit.    Social History   Social History  . Marital Status: Married    Spouse Name: N/A  . Number of Children: 3   . Years of Education: N/A   Occupational History  . RETIRED; Waitress    Social History Main Topics  . Smoking status: Never Smoker   . Smokeless tobacco: Not on file  . Alcohol Use: No  . Drug Use: No  . Sexual Activity: Not on file   Other Topics Concern  . Not on file   Social History Narrative    Family History  Problem Relation Age of Onset  . Lymphoma Mother   . Lymphoma Daughter   . Colon cancer Neg Hx    Social she is widowed; has 3 children 4 grandchildren. No tobacco history. She does not routinely exercise. No alcohol history.  ROS General: Negative; No fevers, chills, or night sweats;  HEENT: Negative; No changes in vision or hearing, sinus congestion, difficulty swallowing Pulmonary: Negative; No cough, wheezing, shortness of breath, hemoptysis Cardiovascular: Negative; No chest pain, presyncope, syncope, palpitations GI: Negative; No nausea, vomiting, diarrhea, or abdominal pain GU: Negative; No dysuria, hematuria, or difficulty voiding Musculoskeletal: Left shoulder discomfort  Hematologic/Oncology: Negative; no easy bruising, bleeding Endocrine: Negative; no heat/cold intolerance; no diabetes Neuro: Negative; no changes in balance, headaches Skin: Negative; No rashes or skin lesions Psychiatric: Negative; No behavioral problems, depression Sleep: Negative; No snoring, daytime sleepiness, hypersomnolence, bruxism, restless legs, hypnogognic hallucinations, no cataplexy Other comprehensive 14 point system review is negative.   PE BP 98/70 mmHg  Pulse 67  Ht 5' 4" (1.626 m)  Wt 119 lb 3.2 oz (54.069 kg)  BMI 20.45 kg/m2  Repeat blood pressure by me was 106/70 Wt Readings from Last 3 Encounters:  05/21/15 119 lb 3.2 oz (54.069 kg)  05/16/14 122 lb (55.339 kg)  05/30/13 127 lb 3.2 oz (57.698 kg)   General: Alert, oriented, no distress.  Skin: normal turgor, no rashes HEENT: Normocephalic, atraumatic. Pupils round and reactive; sclera anicteric;no  lid lag.  Nose without nasal septal hypertrophy Mouth/Parynx benign; Mallinpatti scale 2 Neck: No JVD, no carotid bruits with normal carotid upstroke Lungs: clear to ausculatation and percussion; no wheezing or rales Heart: RRR, s1 s2 normal 1/6 sem; no diastolic murmur.  No S3 or S4 gallop.  No rubs, thrills or heaves. Abdomen: soft, nontender; no hepatosplenomehaly, BS+; abdominal aorta nontender and not dilated by palpation. Back: No CVA tenderness Pulses 2+ Extremities: no clubbing cyanosis or edema, Homan's sign negative  Neurologic: grossly nonfocal  ECG (independently read by me): Normal sinus rhythm at 67 bpm.  Incomplete left bundle-branch block.  QTC 464 ms.  ECG (independently read by me): Sinus bradycardia 57 beats per minute.  Left bundle branch block with repolarization changes.  05/30/2013 ECG: Sinus rhythm at 60 with left bundle-branch block and associated repolarization changes, unchanged  LABS: BMP Latest Ref Rng 11/17/2012 01/15/2009 01/13/2009  Glucose 70 - 99 mg/dL 88 91 96  BUN  6 - 23 mg/dL _0 Creatinine 0.50 - 1.10 mg/dL 0.71 0.69 0.58  Sodium 135 - 145 mEq/L 140 138 141  Potassium 3.5 - 5.3 mEq/L 4.8 4.2 4.3  Chloride 96 - 112 mEq/L 103 102 109  CO2 19 - 32 mEq/L _1 Calcium 8.4 - 10.5 mg/dL 9.7 9.4 8.6   Hepatic Function Latest Ref Rng 11/17/2012 01/07/2009  Total Protein 6.0 - 8.3 g/dL 7.2 5.7(L)  Albumin 3.5 - 5.2 g/dL 4.8 3.0(L)  AST 0 - 37 U/L 25 31  ALT 0 - 35 U/L 19 20  Alk Phosphatase 39 - 117 U/L 66 56  Total Bilirubin 0.3 - 1.2 mg/dL 0.4 0.6   CBC Latest Ref Rng 11/17/2012 01/15/2009 01/14/2009  WBC 4.0 - 10.5 K/uL 5.5 5.8 4.8  Hemoglobin 12.0 - 15.0 g/dL 13.6 12.8 12.6  Hematocrit 36.0 - 46.0 % 39.4 37.1 36.2  Platelets 150 - 400 K/uL 236 206 198   Lab Results  Component Value Date   MCV 89.5 11/17/2012   MCV 92.9 01/15/2009   MCV 92.7 01/14/2009   Lab Results  Component Value Date   TSH 1.565 11/17/2012  No results found  for: HGBA1C   Lipid Panel  No results found for: CHOL, TRIG, HDL, CHOLHDL, VLDL, LDLCALC, LDLDIRECT   RADIOLOGY: No results found.   ASSESSMENT AND PLAN: Shelby Brewer is a very pleasant 69 year old occasion female who has a remote history of takusubo cardiomyopathy with initial ejection fraction of 30-35%. Cardiac catheterization at that time showed scattered 30% lesions in her LAD in 20-30% the right coronary artery. Her ejection fraction has normalized to being hyperdynamic at her last echo.  Her last stress test revealed normal perfusion and hyperdynamic LV function.  In March 2015.  She has not been taking her simvastatin.  I have suggested that she at least try to take this 20 mg 2 times per week.  I am recommending follow-up laboratory be obtained in 3 months.  She is asymptomatic despite somewhat low blood pressure.  Her resting pulse is in the mid 60s.  If her blood pressure continues to be low, metoprolol can be reduced to 25 kg twice a day.  I will contact her regarding her blood work results if changes to her medical therapy are necessary.  In 6 months, I am recommending that she undergo an echo Doppler study to reassess systolic and diastolic function, which will be over 4 years since her last echo assessment.  Time spent: 25 minutes  Troy Sine, MD, [facility]  05/22/2015 6:17 PM

## 2015-05-23 ENCOUNTER — Telehealth: Payer: Self-pay | Admitting: Cardiovascular Disease

## 2015-05-23 MED ORDER — SIMVASTATIN 20 MG PO TABS
20.0000 mg | ORAL_TABLET | Freq: Every evening | ORAL | Status: DC
Start: 2015-05-23 — End: 2016-06-25

## 2015-05-23 NOTE — Telephone Encounter (Signed)
°  1. Which medications need to be refilled? Simvastatin  2. Which pharmacy is medication to be sent to? [facility]   3. Do they need a 30 day or 90 day supply? 90  4. Would they like a call back once the medication has been sent to the pharmacy? Yes

## 2015-05-23 NOTE — Telephone Encounter (Signed)
Refill submitted to patient's preferred pharmacy. Called patient to inform, left message on machine regarding this.

## 2015-05-29 ENCOUNTER — Encounter: Payer: Self-pay | Admitting: Cardiovascular Disease

## 2015-06-17 ENCOUNTER — Other Ambulatory Visit: Payer: Self-pay | Admitting: Cardiovascular Disease

## 2015-06-17 NOTE — Telephone Encounter (Signed)
Rx request sent to pharmacy.  

## 2015-09-09 ENCOUNTER — Telehealth: Payer: Self-pay | Admitting: Cardiovascular Disease

## 2015-09-09 IMAGING — US US EXTREM LOW VENOUS*L*
1 series · 14 of 24 positions shown · non-contrast
Comparison: None.

CLINICAL DATA: Left leg pain

EXAM:
LEFT LOWER EXTREMITY VENOUS DUPLEX ULTRASOUND
TECHNIQUE: Doppler venous assessment of the left lower extremity deep venous
system was performed, including characterization of spectral flow,
compressibility, and phasicity.

[Series 1: us extrem low venous*left* · 0.05mm/px · 14 of 35 slices shown]
[im 1/35]
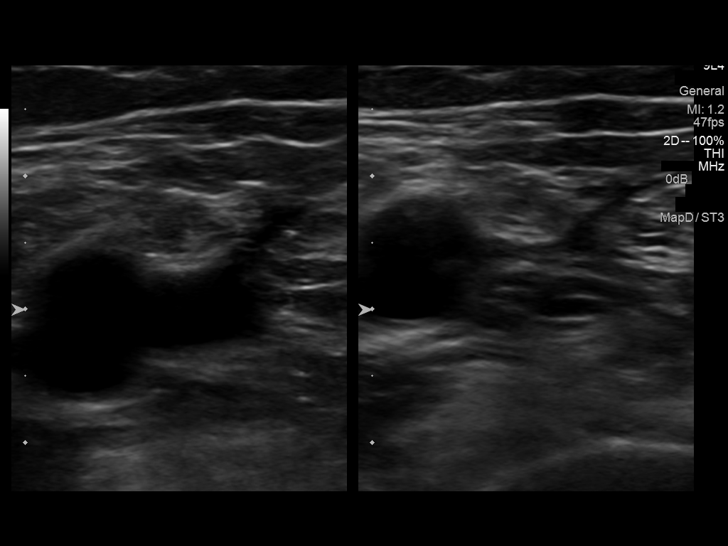
[im 3/35]
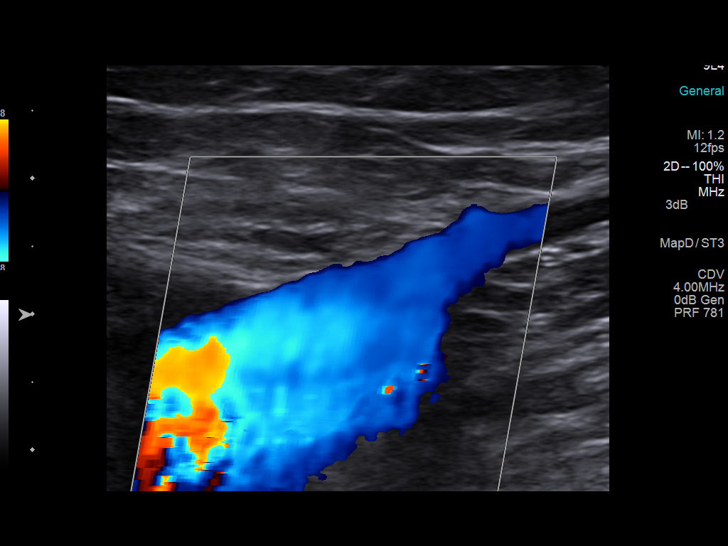
[im 6/35]
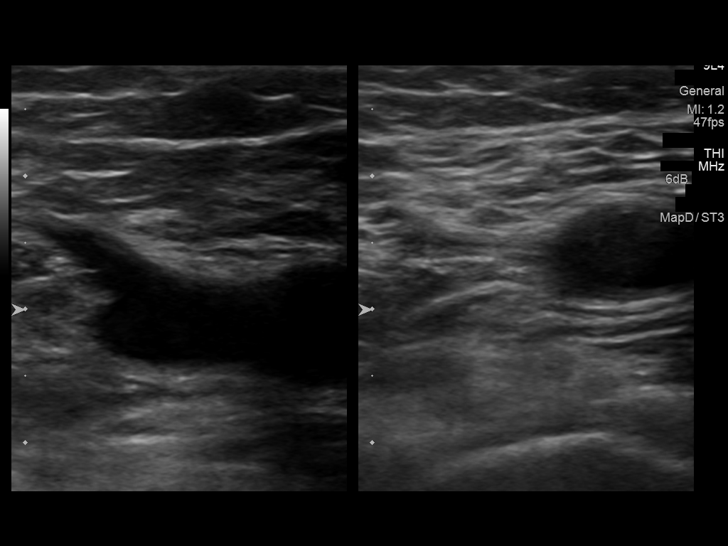
[im 9/35]
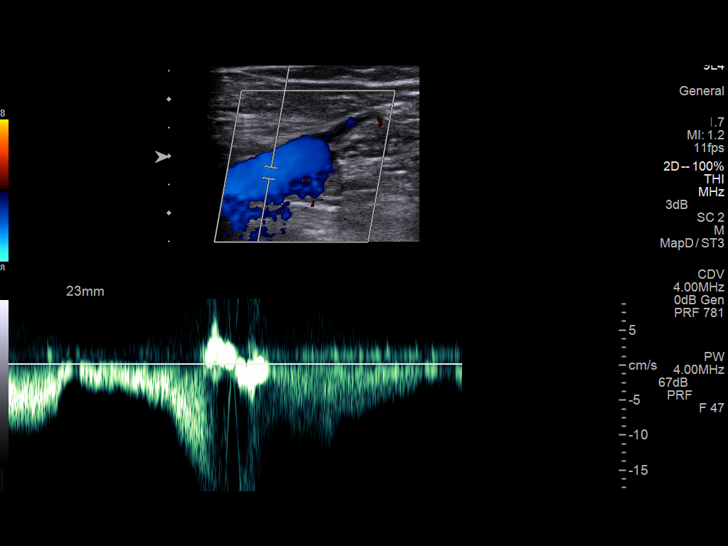
[im 11/35]
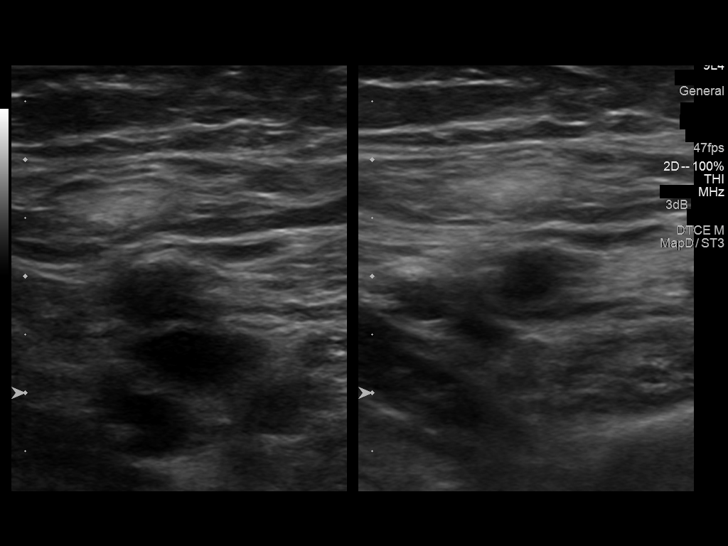
[im 14/35]
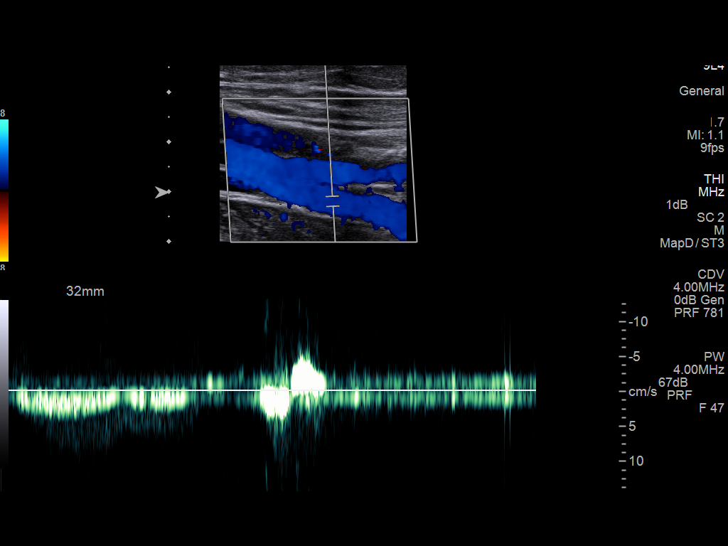
[im 17/35]
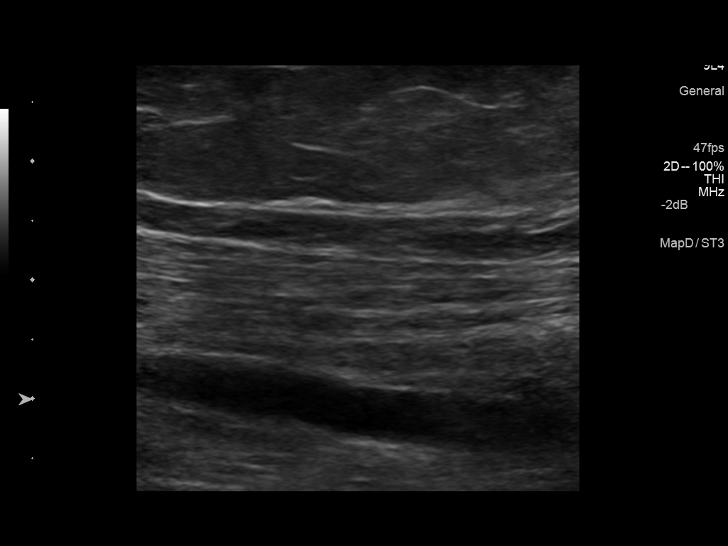
[im 18/35]
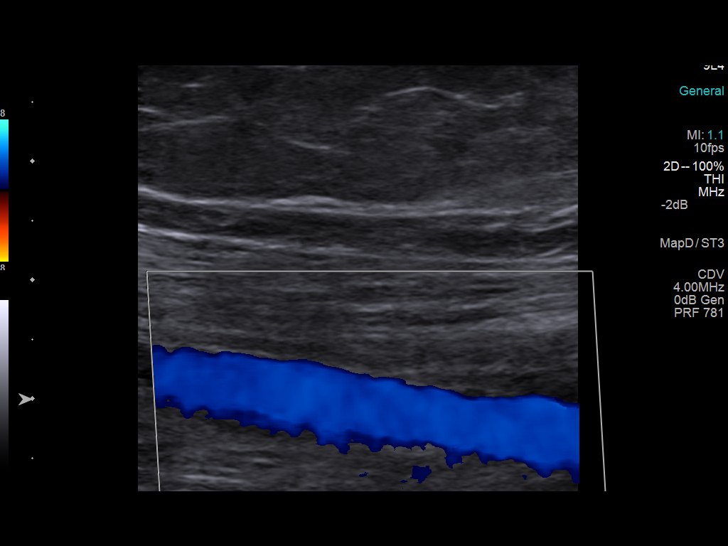
[im 21/35]
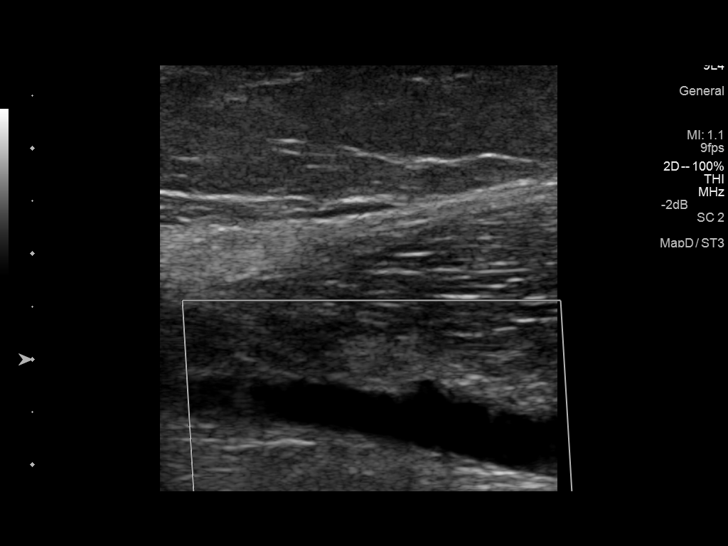
[im 24/35]
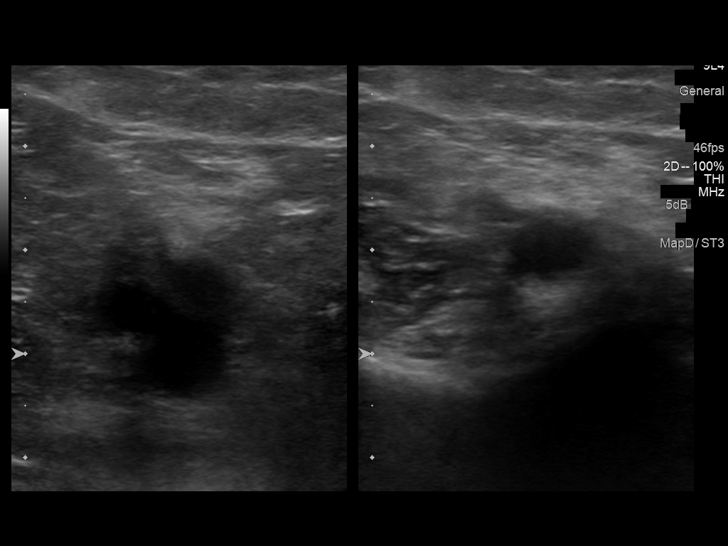
[im 27/35]
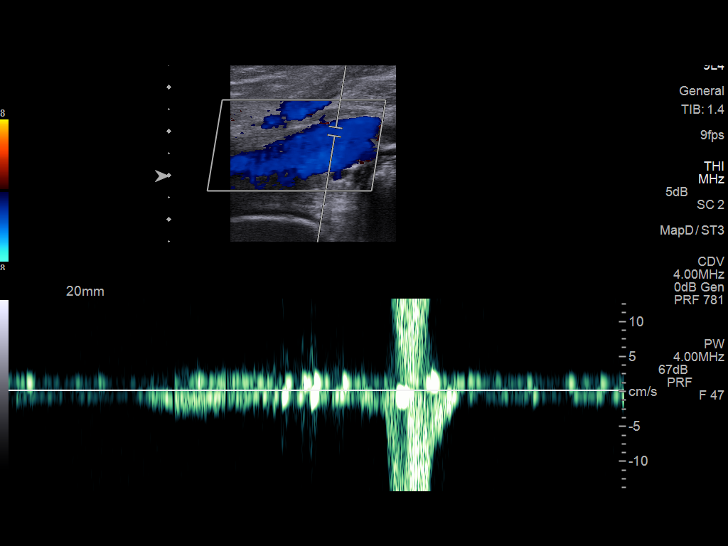
[im 29/35]
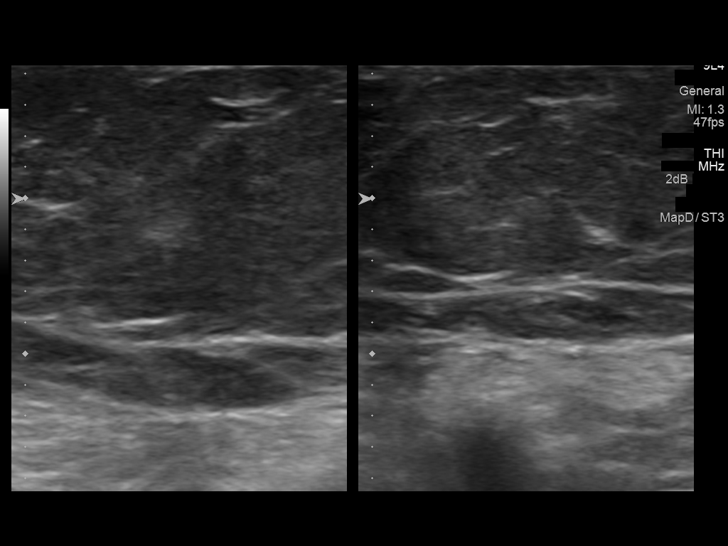
[im 32/35]
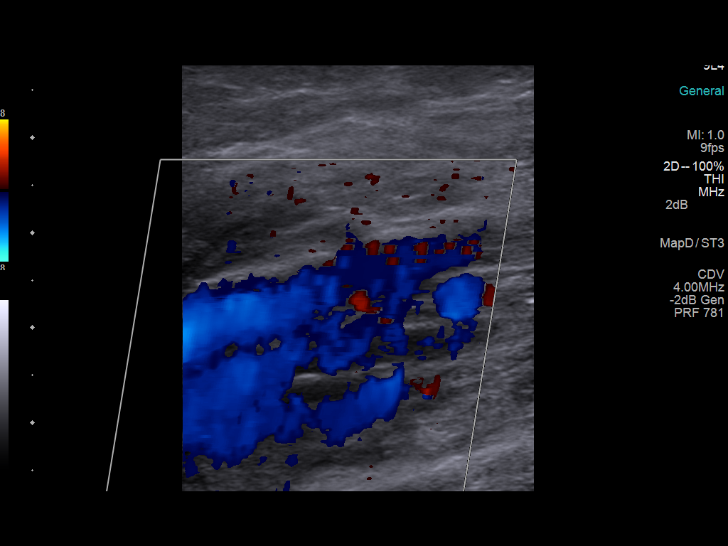
[im 35/35]
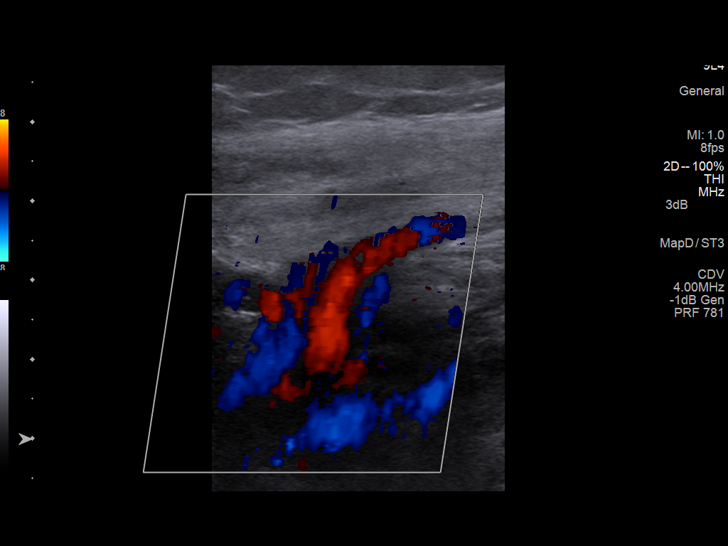

[14 of 24 positions shown; findings below may reference images not displayed]

FINDINGS: There is complete compressibility of the left common femoral,
femoral, and popliteal veins. Doppler analysis demonstrates
respiratory phasicity and augmentation of flow upon calf
compression. No obvious calf vein or superficial vein thrombosis.
IMPRESSION: No evidence of left lower extremity DVT.

## 2015-09-09 NOTE — Telephone Encounter (Signed)
Ms. Shelby Brewer wanted us to know her mail order [facility]Phone # [phone] and fax # [phone].

## 2015-09-10 NOTE — Telephone Encounter (Signed)
Will route this to Dr Landry DykeKelly's covering CMA, to make her aware of this change coming Oct 02, 2015.

## 2015-11-22 ENCOUNTER — Telehealth: Payer: Self-pay | Admitting: Cardiovascular Disease

## 2015-11-22 NOTE — Telephone Encounter (Signed)
New message ° °Pt returning call for rn °

## 2015-11-25 NOTE — Telephone Encounter (Signed)
Please call to see if you can assist patient. I am not aware of any recent call to her. Thanks!

## 2015-12-05 ENCOUNTER — Other Ambulatory Visit: Payer: Self-pay | Admitting: Physician Assistant

## 2015-12-26 ENCOUNTER — Other Ambulatory Visit (HOSPITAL_COMMUNITY): Payer: Medicare Other

## 2015-12-27 ENCOUNTER — Ambulatory Visit (HOSPITAL_COMMUNITY)
Admission: RE | Admit: 2015-12-27 | Discharge: 2015-12-27 | Disposition: A | Discharge status: Home / Self Care | Payer: Medicare Other | Source: Ambulatory Visit | Attending: Cardiovascular Disease | Admitting: Cardiovascular Disease

## 2015-12-27 DIAGNOSIS — I071 Rheumatic tricuspid insufficiency: Secondary | ICD-10-CM | POA: Insufficient documentation

## 2015-12-27 DIAGNOSIS — E785 Hyperlipidemia, unspecified: Secondary | ICD-10-CM | POA: Insufficient documentation

## 2015-12-27 DIAGNOSIS — I5181 Takotsubo syndrome: Secondary | ICD-10-CM

## 2015-12-27 DIAGNOSIS — I2510 Atherosclerotic heart disease of native coronary artery without angina pectoris: Secondary | ICD-10-CM | POA: Diagnosis not present

## 2015-12-27 DIAGNOSIS — I351 Nonrheumatic aortic (valve) insufficiency: Secondary | ICD-10-CM | POA: Diagnosis not present

## 2015-12-27 DIAGNOSIS — I119 Hypertensive heart disease without heart failure: Secondary | ICD-10-CM | POA: Diagnosis not present

## 2015-12-27 DIAGNOSIS — I447 Left bundle-branch block, unspecified: Secondary | ICD-10-CM | POA: Diagnosis not present

## 2015-12-27 DIAGNOSIS — I340 Nonrheumatic mitral (valve) insufficiency: Secondary | ICD-10-CM | POA: Diagnosis not present

## 2016-01-16 ENCOUNTER — Ambulatory Visit (INDEPENDENT_AMBULATORY_CARE_PROVIDER_SITE_OTHER): Payer: Medicare Other | Admitting: Cardiovascular Disease

## 2016-01-16 ENCOUNTER — Encounter: Payer: Self-pay | Admitting: Cardiovascular Disease

## 2016-01-16 VITALS — BP 126/86 | HR 63 | Ht 63.0 in | Wt 119.0 lb

## 2016-01-16 DIAGNOSIS — E039 Hypothyroidism, unspecified: Secondary | ICD-10-CM

## 2016-01-16 DIAGNOSIS — I5181 Takotsubo syndrome: Secondary | ICD-10-CM | POA: Diagnosis not present

## 2016-01-16 DIAGNOSIS — I10 Essential (primary) hypertension: Secondary | ICD-10-CM | POA: Diagnosis not present

## 2016-01-16 DIAGNOSIS — I447 Left bundle-branch block, unspecified: Secondary | ICD-10-CM

## 2016-01-16 DIAGNOSIS — R002 Palpitations: Secondary | ICD-10-CM

## 2016-01-16 DIAGNOSIS — E785 Hyperlipidemia, unspecified: Secondary | ICD-10-CM

## 2016-01-16 MED ORDER — METOPROLOL TARTRATE 25 MG PO TABS: ORAL_TABLET | ORAL | Status: DC

## 2016-01-16 NOTE — Patient Instructions (Signed)
Your physician has recommended you make the following change in your medication:   1. ) the metoprolol has been changed to 2 tablets in the morning ( 50 mg) and 1 tablet  In the evening (25 mg)  Your physician wants you to follow-up in: 6 months or sooner if needed. You will receive a reminder letter in the mail two months in advance. If you don't receive a letter, please call our office to schedule the follow-up appointment.

## 2016-01-18 ENCOUNTER — Encounter: Payer: Self-pay | Admitting: Cardiovascular Disease

## 2016-01-18 DIAGNOSIS — R002 Palpitations: Secondary | ICD-10-CM | POA: Insufficient documentation

## 2016-01-18 NOTE — Progress Notes (Signed)
Patient ID: Shelby Brewer, female   DOB: October 25, 1945, 70 y.o.   MRN: [id number]     HPI: Shelby Brewer is a 70 y.o. female who presents for an 8 month cardiology evaluation.  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 Cardiac catheterization revealed mild 30% narrowings in both her LAD and RCA and there LV dysfunction was out of proportion to her CAD. At that time, she also developed transient atrial fibrillation in the setting of her cardiomyopathy. She has chronic left bundle branch block, hypertension, hyperlipidemia, hypothyroidism, and previously documented moderate MR and TR. Her last echo Doppler study in October 2012 showed hyperdynamic LV function with an EF of 65%. She had grade 1 diastolic dysfunction.  Two years ago she had noticed episodes of nonexertionalchest pressure in the left upper chest radiating to her shoulder. She does admit to being under increased stress.  She underwent a nuclear perfusion study in [place] in March 2015. She had a normal LexiScan study without evidence for myocardial ischemia or scar.  Post stress ejection fraction was 77%.  Her husband died in 09/22/13.  She has had some arthritic issues for which she has taken Mobitz for her shoulder.  Since I last saw her, she denies any exertionally precipitated chest pain.  She has noticed occasional palpitations.  She had lab work by her primary care office one month ago and was started on vitamin D for vitamin D insufficiency.  On 12/27/2015.  A follow-up echo Doppler study reconfirmed normal systolic function with an EF of 60-65%.  There was grade 1 diastolic dysfunction.  She had abnormal tissue Doppler suggesting high ventricular filling pressures.  There was mild concentric and moderate focal basal septal hypertrophy.  There was mitral annular calcification with moderate -severe MR.  There was mild to moderate TR.  LV dimensions were normal.   She presents for reevaluation.  Past Medical History  Diagnosis Date  . Atrial fibrillation ([place])     setting of her takotsubo cardiomyopathy in may 2010 EF 30%-35%  . MI (myocardial infarction) ([place])   . Hypothyroid   . Anxiety   . Arthritis   . Diarrhea   . CAD (coronary artery disease)     She had nonobstructive CAD and her LV dysfunction was out of proportion to her coronary artery disease.  Marland Kitchen LBBB (left bundle branch block)     chronic  . Hypertension   . Hyperlipemia   . Hx of echocardiogram 06/2011    showed hyperdynamic LV function now with an EF of 65%, she had abnormal tissue doppler and grade 1 diastolic dysfunction with moderate MR and TR.  Marland Kitchen History of stress test 06/26/2010    compared to the previous study, there is no significant change. Normal myocardial perfusion study, this is a low risk scan.    Past Surgical History  Procedure Laterality Date  . Colonoscopy    . Left breast cystectomy    . Colonoscopy N/A 11/24/2012    WUJ:[id number] diverticulosis-status post segmental biopsies, NEGATIVE PATH.   . Cardiac catheterization  01/08/2009    mild coronary artery disease, notably in the left anterior descending and the right coronary artery. 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    No Known Allergies  Current Outpatient Prescriptions  Medication Sig Dispense Refill  . Biotin 5000 MCG CAPS Take 1 capsule by mouth daily.    Marland Kitchen  calcium-vitamin D (OSCAL WITH D) 500-200 MG-UNIT per tablet Take 1 tablet by mouth daily.    . clonazePAM (KLONOPIN) 1 MG tablet Take 1 mg by mouth daily as needed.     Marland Kitchen levothyroxine (SYNTHROID, LEVOTHROID) 50 MCG tablet Take 50 mcg by mouth daily.    . meloxicam (MOBIC) 7.5 MG tablet Take 7.5 mg by mouth daily as needed.     . metoprolol tartrate (LOPRESSOR) 25 MG tablet Take 2 tablets in the  Morning and 1 tablet in the evening 225 tablet 1  . Multiple Vitamin (MULTIVITAMIN  WITH MINERALS) TABS Take 1 tablet by mouth daily.    . Omega-3 Fatty Acids (FISH OIL) 1000 MG CAPS Take 1 capsule by mouth daily.    . simvastatin (ZOCOR) 20 MG tablet Take 1 tablet (20 mg total) by mouth every evening. 90 tablet 3  . zinc gluconate 50 MG tablet Take 50 mg by mouth daily.     No current facility-administered medications for this visit.    Social History   Social History  . Marital Status: Married    Spouse Name: N/A  . Number of Children: 3  . Years of Education: N/A   Occupational History  . RETIRED; Waitress    Social History Main Topics  . Smoking status: Never Smoker   . Smokeless tobacco: Not on file  . Alcohol Use: No  . Drug Use: No  . Sexual Activity: Not on file   Other Topics Concern  . Not on file   Social History Narrative    Family History  Problem Relation Age of Onset  . Lymphoma Mother   . Lymphoma Daughter   . Colon cancer Neg Hx    Social she is widowed; has 3 children 4 grandchildren. No tobacco history. She does not routinely exercise. No alcohol history.  ROS General: Negative; No fevers, chills, or night sweats;  HEENT: Negative; No changes in vision or hearing, sinus congestion, difficulty swallowing Pulmonary: Negative; No cough, wheezing, shortness of breath, hemoptysis Cardiovascular: Negative; No chest pain, presyncope, syncope, palpitations GI: Negative; No nausea, vomiting, diarrhea, or abdominal pain GU: Negative; No dysuria, hematuria, or difficulty voiding Musculoskeletal: Left shoulder discomfort  Hematologic/Oncology: Negative; no easy bruising, bleeding Endocrine: Negative; no heat/cold intolerance; no diabetes Neuro: Negative; no changes in balance, headaches Skin: Negative; No rashes or skin lesions Psychiatric: Negative; No behavioral problems, depression Sleep: Negative; No snoring, daytime sleepiness, hypersomnolence, bruxism, restless legs, hypnogognic hallucinations, no cataplexy Other comprehensive 14  point system review is negative.   PE BP 126/86 mmHg  Pulse 63  Ht '5\' 3"'$  (1.6 m)  Wt 119 lb (53.978 kg)  BMI 21.09 kg/m2   Wt Readings from Last 3 Encounters:  01/16/16 119 lb (53.978 kg)  05/21/15 119 lb 3.2 oz (54.069 kg)  05/16/14 122 lb (55.339 kg)   General: Alert, oriented, no distress.  Skin: normal turgor, no rashes HEENT: Normocephalic, atraumatic. Pupils round and reactive; sclera anicteric;no lid lag.  Nose without nasal septal hypertrophy Mouth/Parynx benign; Mallinpatti scale 2 Neck: No JVD, no carotid bruits with normal carotid upstroke Lungs: clear to ausculatation and percussion; no wheezing or rales Heart: RRR, s1 s2 normal 1/6 sem; no diastolic murmur.  No S3 or S4 gallop.  No rubs, thrills or heaves. Abdomen: soft, nontender; no hepatosplenomehaly, BS+; abdominal aorta nontender and not dilated by palpation. Back: No CVA tenderness Pulses 2+ Extremities: no clubbing cyanosis or edema, Homan's sign negative  Neurologic: grossly nonfocal  ECG (independently read  by me): Normal sinus rhythm at 63 bpm.  Incomplete left bundle branch block.  Nondiagnostic inferolateral ST changes.  September 2016 ECG (independently read by me): Normal sinus rhythm at 67 bpm.  Incomplete left bundle-branch block.  QTC 464 ms.  September 2015 ECG (independently read by me): Sinus bradycardia 57 beats per minute.  Left bundle branch block with repolarization changes.  05/30/2013 ECG: Sinus rhythm at 60 with left bundle-branch block and associated repolarization changes, unchanged  LABS: BMP Latest Ref Rng 11/17/2012 01/15/2009 01/13/2009  Glucose 70 - 99 mg/dL 88 91 96  BUN 6 - 23 mg/dL _0 Creatinine 0.50 - 1.10 mg/dL 0.71 0.69 0.58  Sodium 135 - 145 mEq/L 140 138 141  Potassium 3.5 - 5.3 mEq/L 4.8 4.2 4.3  Chloride 96 - 112 mEq/L 103 102 109  CO2 19 - 32 mEq/L _1 Calcium 8.4 - 10.5 mg/dL 9.7 9.4 8.6   Hepatic Function Latest Ref Rng 11/17/2012 01/07/2009  Total  Protein 6.0 - 8.3 g/dL 7.2 5.7(L)  Albumin 3.5 - 5.2 g/dL 4.8 3.0(L)  AST 0 - 37 U/L 25 31  ALT 0 - 35 U/L 19 20  Alk Phosphatase 39 - 117 U/L 66 56  Total Bilirubin 0.3 - 1.2 mg/dL 0.4 0.6   CBC Latest Ref Rng 11/17/2012 01/15/2009 01/14/2009  WBC 4.0 - 10.5 K/uL 5.5 5.8 4.8  Hemoglobin 12.0 - 15.0 g/dL 13.6 12.8 12.6  Hematocrit 36.0 - 46.0 % 39.4 37.1 36.2  Platelets 150 - 400 K/uL 236 206 198   Lab Results  Component Value Date   MCV 89.5 11/17/2012   MCV 92.9 01/15/2009   MCV 92.7 01/14/2009   Lab Results  Component Value Date   TSH 1.565 11/17/2012  No results found for: HGBA1C   Lipid Panel  No results found for: CHOL, TRIG, HDL, CHOLHDL, VLDL, LDLCALC, LDLDIRECT   RADIOLOGY: No results found.   ASSESSMENT AND PLAN: Shelby Brewer is a very pleasant 70 year old occasion female who has a remote history of takusubo cardiomyopathy with initial ejection fraction of 30-35%. Cardiac catheterization at that time showed scattered 30% lesions in her LAD and 20-30% in her RCA. Her ejection fraction has normalized. Her last stress test revealed normal perfusion and hyperdynamic LV function.  Her most recent echo Doppler study reconfirms normal systolic function.  There was evidence for moderate to severe MR with mild to moderate TR.  She had grade 1 diastolic dysfunction.  In the past, she had stopped her simvastatin but has been back on this therapy.  She denies myalgias and is able to tolerate this.  She has experienced some episodes of palpitations.  I have recommended slight additional titration of metoprolol and she will now take 50 [place] grams in the morning and 25 mg at night.  Blood pressure today is stable.  She has a history of hypothyroidism and is tolerating Synthroid 50 g daily.  She continues to be on simvastatin 20 mg daily.  She was recently started on 50,000 units of vitamin D.  I will try to obtain the results of recent blood work that she had at [facility].  I  will see her in 6 months for reevaluation.  Time spent: 25 minutes  Troy Sine, MD, [facility]  01/18/2016 10:44 AM

## 2016-04-08 ENCOUNTER — Other Ambulatory Visit: Payer: Self-pay | Admitting: *Deleted

## 2016-04-08 MED ORDER — METOPROLOL TARTRATE 25 MG PO TABS: ORAL_TABLET | ORAL | 1 refills | Status: DC

## 2016-06-25 ENCOUNTER — Other Ambulatory Visit: Payer: Self-pay

## 2016-06-25 MED ORDER — SIMVASTATIN 20 MG PO TABS: 20.0000 mg | ORAL_TABLET | Freq: Every evening | ORAL | 2 refills | Status: DC

## 2016-08-14 ENCOUNTER — Other Ambulatory Visit: Payer: Self-pay | Admitting: *Deleted

## 2016-08-19 ENCOUNTER — Other Ambulatory Visit: Payer: Self-pay

## 2016-08-19 MED ORDER — METOPROLOL TARTRATE 25 MG PO TABS: ORAL_TABLET | ORAL | 2 refills | Status: DC

## 2017-02-24 ENCOUNTER — Other Ambulatory Visit: Payer: Self-pay

## 2017-02-24 MED ORDER — SIMVASTATIN 20 MG PO TABS: 20.0000 mg | ORAL_TABLET | Freq: Every evening | ORAL | 0 refills | Status: DC

## 2017-04-20 ENCOUNTER — Other Ambulatory Visit: Payer: Self-pay

## 2017-04-20 MED ORDER — METOPROLOL TARTRATE 25 MG PO TABS: ORAL_TABLET | ORAL | 0 refills | Status: AC

## 2017-05-14 ENCOUNTER — Other Ambulatory Visit: Payer: Self-pay

## 2017-05-14 MED ORDER — SIMVASTATIN 20 MG PO TABS: 20.0000 mg | ORAL_TABLET | Freq: Every evening | ORAL | 0 refills | Status: DC

## 2017-05-19 ENCOUNTER — Telehealth: Payer: Self-pay | Admitting: Cardiovascular Disease

## 2017-05-19 NOTE — Telephone Encounter (Signed)
New message     *STAT* If patient is at the pharmacy, call can be transferred to refill team.   1. Which medications need to be refilled? (please list name of each medication and dose if known) Simvastatin 20 mg  2. Which pharmacy/location (including street and city if local pharmacy) is medication to be sent to? [facility]  3. Do they need a 30 day or 90 day supply? 90 day

## 2017-05-20 MED ORDER — SIMVASTATIN 20 MG PO TABS: 20.0000 mg | ORAL_TABLET | Freq: Every evening | ORAL | 0 refills | Status: AC

## 2017-05-20 NOTE — Telephone Encounter (Signed)
rx refilled to requested pharmacy. Attempted to reach pt, no answer when dialed. She will need f/u appt.
